# Patient Record
Sex: Male | Born: 1990 | Hispanic: No | Marital: Single | Smoking: Current every day smoker
Health system: Southern US, Community
[De-identification: ages and names within clinical notes are randomized; demographics above are authoritative.]

## PROBLEM LIST (undated history)

## (undated) DIAGNOSIS — R1013 Epigastric pain: Secondary | ICD-10-CM

---

## 2014-10-15 ENCOUNTER — Emergency Department (INDEPENDENT_AMBULATORY_CARE_PROVIDER_SITE_OTHER)
Admission: EM | Admit: 2014-10-15 | Discharge: 2014-10-15 | Disposition: A | Discharge status: Nursing Home (Includes ICF) | Payer: Self-pay | Source: Home / Self Care | Attending: Family Medicine | Admitting: Family Medicine

## 2014-10-15 ENCOUNTER — Emergency Department (HOSPITAL_COMMUNITY)
Admission: EM | Admit: 2014-10-15 | Discharge: 2014-10-15 | Disposition: A | Discharge status: Home / Self Care | Payer: Self-pay | Attending: Emergency Medicine | Admitting: Emergency Medicine

## 2014-10-15 ENCOUNTER — Encounter (HOSPITAL_COMMUNITY): Payer: Self-pay | Admitting: Emergency Medicine

## 2014-10-15 ENCOUNTER — Emergency Department (HOSPITAL_COMMUNITY): Payer: Self-pay

## 2014-10-15 ENCOUNTER — Encounter (HOSPITAL_COMMUNITY): Payer: Self-pay | Admitting: *Deleted

## 2014-10-15 DIAGNOSIS — R52 Pain, unspecified: Secondary | ICD-10-CM

## 2014-10-15 DIAGNOSIS — R509 Fever, unspecified: Secondary | ICD-10-CM

## 2014-10-15 DIAGNOSIS — R1084 Generalized abdominal pain: Secondary | ICD-10-CM | POA: Insufficient documentation

## 2014-10-15 DIAGNOSIS — R000 Tachycardia, unspecified: Secondary | ICD-10-CM | POA: Insufficient documentation

## 2014-10-15 DIAGNOSIS — J029 Acute pharyngitis, unspecified: Secondary | ICD-10-CM | POA: Insufficient documentation

## 2014-10-15 DIAGNOSIS — R252 Cramp and spasm: Secondary | ICD-10-CM

## 2014-10-15 DIAGNOSIS — Z720 Tobacco use: Secondary | ICD-10-CM | POA: Insufficient documentation

## 2014-10-15 LAB — CBC WITH DIFFERENTIAL/PLATELET
BASOS ABS: 0 10*3/uL (ref 0.0–0.1)
Basophils Relative: 0 % (ref 0–1)
Eosinophils Absolute: 0 10*3/uL (ref 0.0–0.7)
Eosinophils Relative: 0 % (ref 0–5)
HCT: 42.3 % (ref 39.0–52.0)
HEMOGLOBIN: 14.7 g/dL (ref 13.0–17.0)
LYMPHS PCT: 9 % — AB (ref 12–46)
Lymphs Abs: 0.8 10*3/uL (ref 0.7–4.0)
MCH: 28.8 pg (ref 26.0–34.0)
MCHC: 34.8 g/dL (ref 30.0–36.0)
MCV: 82.8 fL (ref 78.0–100.0)
MONO ABS: 0.5 10*3/uL (ref 0.1–1.0)
Monocytes Relative: 6 % (ref 3–12)
Neutro Abs: 7.1 10*3/uL (ref 1.7–7.7)
Neutrophils Relative %: 85 % — ABNORMAL HIGH (ref 43–77)
Platelets: 193 10*3/uL (ref 150–400)
RBC: 5.11 MIL/uL (ref 4.22–5.81)
RDW: 13 % (ref 11.5–15.5)
WBC: 8.4 10*3/uL (ref 4.0–10.5)

## 2014-10-15 LAB — POCT I-STAT, CHEM 8
BUN: 7 mg/dL (ref 6–20)
CHLORIDE: 99 mmol/L — AB (ref 101–111)
CREATININE: 0.8 mg/dL (ref 0.61–1.24)
Calcium, Ion: 1.1 mmol/L — ABNORMAL LOW (ref 1.12–1.23)
GLUCOSE: 105 mg/dL — AB (ref 70–99)
HEMATOCRIT: 50 % (ref 39.0–52.0)
Hemoglobin: 17 g/dL (ref 13.0–17.0)
POTASSIUM: 3.2 mmol/L — AB (ref 3.5–5.1)
SODIUM: 136 mmol/L (ref 135–145)
TCO2: 15 mmol/L (ref 0–100)

## 2014-10-15 LAB — COMPREHENSIVE METABOLIC PANEL
ALT: 22 U/L (ref 17–63)
AST: 23 U/L (ref 15–41)
Albumin: 4.2 g/dL (ref 3.5–5.0)
Alkaline Phosphatase: 62 U/L (ref 38–126)
Anion gap: 11 (ref 5–15)
BUN: 6 mg/dL (ref 6–20)
CALCIUM: 9.1 mg/dL (ref 8.9–10.3)
CO2: 21 mmol/L — AB (ref 22–32)
CREATININE: 0.88 mg/dL (ref 0.61–1.24)
Chloride: 101 mmol/L (ref 101–111)
GLUCOSE: 94 mg/dL (ref 70–99)
Potassium: 3 mmol/L — ABNORMAL LOW (ref 3.5–5.1)
Sodium: 133 mmol/L — ABNORMAL LOW (ref 135–145)
TOTAL PROTEIN: 7.5 g/dL (ref 6.5–8.1)
Total Bilirubin: 1 mg/dL (ref 0.3–1.2)

## 2014-10-15 LAB — RAPID STREP SCREEN (MED CTR MEBANE ONLY): STREPTOCOCCUS, GROUP A SCREEN (DIRECT): NEGATIVE

## 2014-10-15 LAB — CK: CK TOTAL: 129 U/L (ref 49–397)

## 2014-10-15 LAB — LIPASE, BLOOD: Lipase: 20 U/L — ABNORMAL LOW (ref 22–51)

## 2014-10-15 MED ORDER — SODIUM CHLORIDE 0.9 % IV SOLN
Freq: Once | INTRAVENOUS | Status: AC
  Administered 2014-10-15: 15:00:00 via INTRAVENOUS

## 2014-10-15 MED ORDER — HYDROMORPHONE HCL 1 MG/ML IJ SOLN
INTRAMUSCULAR | Status: AC
  Filled 2014-10-15: qty 1

## 2014-10-15 MED ORDER — SODIUM CHLORIDE 0.9 % IV BOLUS (SEPSIS)
1000.0000 mL | Freq: Once | INTRAVENOUS | Status: AC
  Administered 2014-10-15: 1000 mL via INTRAVENOUS

## 2014-10-15 MED ORDER — POTASSIUM CHLORIDE CRYS ER 20 MEQ PO TBCR
40.0000 meq | EXTENDED_RELEASE_TABLET | Freq: Once | ORAL | Status: AC
  Administered 2014-10-15: 40 meq via ORAL
  Filled 2014-10-15: qty 2

## 2014-10-15 MED ORDER — ACETAMINOPHEN 325 MG PO TABS: 975.0000 mg | ORAL_TABLET | Freq: Once | ORAL | Status: DC

## 2014-10-15 MED ORDER — HYDROMORPHONE HCL 1 MG/ML IJ SOLN: 1.0000 mg | Freq: Once | INTRAMUSCULAR | Status: DC

## 2014-10-15 MED ORDER — HYDROMORPHONE HCL 1 MG/ML IJ SOLN
1.0000 mg | Freq: Once | INTRAMUSCULAR | Status: AC
  Administered 2014-10-15: 1 mg via INTRAVENOUS
  Filled 2014-10-15: qty 1

## 2014-10-15 MED ORDER — ACETAMINOPHEN 325 MG PO TABS
650.0000 mg | ORAL_TABLET | Freq: Once | ORAL | Status: AC
  Administered 2014-10-15: 650 mg via ORAL
  Filled 2014-10-15: qty 2

## 2014-10-15 MED ORDER — HYDROMORPHONE HCL 1 MG/ML IJ SOLN
0.5000 mg | Freq: Once | INTRAMUSCULAR | Status: AC
  Administered 2014-10-15: 0.5 mg via INTRAVENOUS

## 2014-10-15 MED ORDER — KETOROLAC TROMETHAMINE 15 MG/ML IJ SOLN
15.0000 mg | Freq: Once | INTRAMUSCULAR | Status: AC
  Administered 2014-10-15: 15 mg via INTRAVENOUS
  Filled 2014-10-15: qty 1

## 2014-10-15 NOTE — ED Notes (Signed)
Pt writhing, thrashing, screaming out, hyperventilating.  Difficult to assess.  Carpal spasms noted.

## 2014-10-15 NOTE — ED Notes (Signed)
Report to [facility]  

## 2014-10-15 NOTE — ED Provider Notes (Signed)
Caleb Phillips is a 24 y.o. male who presents to Urgent Care today for spasms.  Patient arrived to the waiting room of the urgent care screaming and writhing in pain complaining of spasms in his hands and abdomen.  He additionally notes a severe headache. He states that he has had severe pain for over 24 hours now.  He is unable to answer questions regarding other associated symptoms. He is nearly incoherent with pain.  He states that he took some of his wife's medicine but is unable to tell us what that is.   He denies any spider bites.  He states that his symptoms were similar to when he had malaria 5 years ago but has not left the [place] since then.   History reviewed. No pertinent past medical history. History reviewed. No pertinent past surgical history. History  Substance Use Topics  . Smoking status: Current Every Day Smoker    Types: Cigarettes  . Smokeless tobacco: Not on file  . Alcohol Use: Not on file   ROS as above Medications: Current Facility-Administered Medications  Medication Dose Route Frequency Provider Last Rate Last Dose  . 0.9 %  sodium chloride infusion   Intravenous Once Rodolph BongEvan S Carvell Hoeffner, MD      . acetaminophen (TYLENOL) tablet 975 mg  975 mg Oral Once Rodolph BongEvan S Vito Beg, MD       No current outpatient prescriptions on file.   No Known Allergies   Exam:  BP 136/77 mmHg  Pulse 107  Temp(Src) 103.2 F (39.6 C) (Oral)  Resp 24  SpO2 100% Gen: Writhing and screaming HEENT: EOMI,  MMM Lungs: Normal work of breathing. Crackles left lung Heart: Tachy no MRG Abd: NABS, Soft. Nondistended, Nontender Exts: Brisk capillary refill, warm and well perfused.  Cramping hands BL.   Patient was given 0.5 mg of IV Dilaudid 975 mg of oral Tylenol.  Patient felt much better following Dilaudid.  Results for orders placed or performed during the hospital encounter of 10/15/14 (from the past 24 hour(s))  I-STAT, chem 8     Status: Abnormal   Collection Time: 10/15/14  2:49 PM   Result Value Ref Range   Sodium 136 135 - 145 mmol/L   Potassium 3.2 (L) 3.5 - 5.1 mmol/L   Chloride 99 (L) 101 - 111 mmol/L   BUN 7 6 - 20 mg/dL   Creatinine, Ser 4.090.80 0.61 - 1.24 mg/dL   Glucose, Bld 811105 (H) 70 - 99 mg/dL   Calcium, Ion 9.141.10 (L) 1.12 - 1.23 mmol/L   TCO2 15 0 - 100 mmol/L   Hemoglobin 17.0 13.0 - 17.0 g/dL   HCT 78.250.0 95.639.0 - 21.352.0 %   No results found.  Assessment and Plan: 24 y.o. male with pain and muscle spasms associated with headache and fever. Unclear etiology. Transfer to ED via EMS for further evaluation and management.   Discussed warning signs or symptoms. Please see discharge instructions. Patient expresses understanding.     Rodolph BongEvan S Grettell Ransdell, MD 10/15/14 [phone]

## 2014-10-15 NOTE — Discharge Instructions (Signed)
Take tylenol every 4 hrs for fever and aches.  If you were given medicines take as directed.  If you are on coumadin or contraceptives realize their levels and effectiveness is altered by many different medicines.  If you have any reaction (rash, tongues swelling, other) to the medicines stop taking and see a physician.   Please follow up as directed and return to the ER or see a physician for new or worsening symptoms.  Thank you. Filed Vitals:   10/15/14 1851 10/15/14 1852 10/15/14 1857 10/15/14 1900  BP: 119/59   114/56  Pulse: 97 107  98  Temp:   100.1 F (37.8 C)   TempSrc:   Oral   Resp:  17  14  Height:      Weight:      SpO2: 98% 100%  98%

## 2014-10-15 NOTE — ED Notes (Signed)
Pt much more calm now.  States stomach pain is improving.  States wife was sick last week "with same thing".  Took some IBU this AM.  Has had poor appetite.  Denies diarrhea.

## 2014-10-15 NOTE — ED Notes (Signed)
Pt presents to ED via [facility] from urgent care with c/o abdominal pain associated with nausea and vomiting, fever-temp-103.5 at urgent care, and sore throat. Per [facility], pt given 0.5mg  dilaudid. BP-135/73 and HR-110. Pt speaks [place] and some [place].

## 2014-10-15 NOTE — ED Provider Notes (Signed)
CSN: [id number]     Arrival date & time 10/15/14  1510 History   First MD Initiated Contact with Patient 10/15/14 1527     Chief Complaint  Patient presents with  . Abdominal Pain  . Fever     (Consider location/radiation/quality/duration/timing/severity/associated sxs/prior Treatment) Patient is a 24 y.o. male presenting with abdominal pain. The history is provided by the patient.  Abdominal Pain Pain location:  Generalized Pain quality: cramping   Pain radiates to:  Does not radiate Pain severity:  Moderate Onset quality:  Gradual Duration:  1 day Timing:  Intermittent Progression:  Waxing and waning Chronicity:  New Context: sick contacts   Context: not diet changes, not recent travel, not suspicious food intake and not trauma   Relieved by:  Nothing Worsened by:  Nothing tried Ineffective treatments:  None tried Associated symptoms: cough, fever and sore throat   Associated symptoms: no anorexia, no chest pain, no chills, no constipation, no diarrhea, no dysuria, no fatigue, no hematemesis, no hematochezia, no hematuria, no melena, no nausea, no shortness of breath and no vomiting   Cough:    Cough characteristics:  Non-productive   Sputum characteristics:  Nondescript   Severity:  Moderate   Onset quality:  Gradual   Duration:  2 days   Timing:  Constant   Progression:  Unchanged   Chronicity:  New Fever:    Duration:  2 days   Timing:  Intermittent   Temp source:  Subjective   Progression:  Waxing and waning Sore throat:    Severity:  Moderate   Onset quality:  Gradual   Duration:  2 days   Timing:  Constant   Progression:  Unchanged   History reviewed. No pertinent past medical history. History reviewed. No pertinent past surgical history. History reviewed. No pertinent family history. History  Substance Use Topics  . Smoking status: Current Every Day Smoker -- 0.50 packs/day    Types: Cigarettes  . Smokeless tobacco: Former Neurosurgeon  . Alcohol Use: Yes     Comment: occ    Review of Systems  Constitutional: Positive for fever. Negative for chills, diaphoresis, activity change, appetite change and fatigue.  HENT: Positive for sore throat. Negative for congestion, drooling, ear pain, rhinorrhea, trouble swallowing and voice change.   Respiratory: Positive for cough. Negative for chest tightness, shortness of breath, wheezing and stridor.   Cardiovascular: Negative for chest pain, palpitations and leg swelling.  Gastrointestinal: Positive for abdominal pain. Negative for nausea, vomiting, diarrhea, constipation, melena, hematochezia, abdominal distention, anorexia and hematemesis.  Genitourinary: Negative for dysuria, hematuria and difficulty urinating.  Musculoskeletal: Negative for back pain, neck pain and neck stiffness.  Skin: Negative for color change, pallor and rash.  Neurological: Negative for dizziness, light-headedness and headaches.  All other systems reviewed and are negative.     Allergies  Review of patient's allergies indicates no known allergies.  Home Medications   Prior to Admission medications   Not on File   BP 111/59 mmHg  Pulse 88  Temp(Src) 98.9 F (37.2 C) (Oral)  Resp 16  Ht  (1.727 m)  Wt 169 lb (76.658 kg)  BMI 25.70 kg/m2  SpO2 97% Physical Exam  Constitutional: He is oriented to person, place, and time. He appears well-developed and well-nourished. No distress.  HENT:  Head: Normocephalic and atraumatic.  Right Ear: Tympanic membrane normal.  Left Ear: Tympanic membrane normal.  Mouth/Throat: Posterior oropharyngeal erythema present. No oropharyngeal exudate, posterior oropharyngeal edema or tonsillar abscesses.  Neck:  Normal range of motion. Neck supple.  Cardiovascular: Regular rhythm, normal heart sounds and intact distal pulses.  Tachycardia present.  Exam reveals no gallop and no friction rub.   No murmur heard. Pulmonary/Chest: Effort normal and breath sounds normal. No respiratory  distress. He has no wheezes. He has no rales.  Abdominal: Soft. Normal appearance and bowel sounds are normal. He exhibits no distension. There is no tenderness. There is no rigidity, no rebound, no guarding, no tenderness at McBurney's point and negative Murphy's sign.  Musculoskeletal: Normal range of motion. He exhibits no edema.  Neurological: He is alert and oriented to person, place, and time.  Skin: Skin is warm and dry. No rash noted. He is not diaphoretic. No erythema. No pallor.  Nursing note and vitals reviewed.   ED Course  Procedures (including critical care time) Labs Review Labs Reviewed  COMPREHENSIVE METABOLIC PANEL - Abnormal; Notable for the following:    Sodium 133 (*)    Potassium 3.0 (*)    CO2 21 (*)    All other components within normal limits  CBC WITH DIFFERENTIAL/PLATELET - Abnormal; Notable for the following:    Neutrophils Relative % 85 (*)    Lymphocytes Relative 9 (*)    All other components within normal limits  LIPASE, BLOOD - Abnormal; Notable for the following:    Lipase 20 (*)    All other components within normal limits  RAPID STREP SCREEN  CULTURE, GROUP A STREP  CK    Imaging Review Dg Chest 2 View  10/15/2014   CLINICAL DATA:  Fevers and cramping  EXAM: CHEST  2 VIEW  COMPARISON:  None.  FINDINGS: Cardiac shadow is within normal limits. The lungs are well aerated bilaterally. Mild atelectatic changes are noted in the left retrocardiac region. No sizable effusion is seen. No bony abnormality is seen.  IMPRESSION: Mild left basilar atelectasis.  No other focal abnormality is noted.   Electronically Signed   By: Alcide CleverMark  Lukens M.D.   On: 10/15/2014 19:10     EKG Interpretation None      MDM   Final diagnoses:  Fever, unspecified fever cause  Sore throat    24 yo M with no significant PMH presenting with fever, sore throat, abdominal pain.  Onset yesterday.  Pt reports sore throat began yesterday with non-productive cough and subjective  fever.  Onset today of abdominal pain.  Pt reports spasms of abdominal pain, generalized, non-radiating.  No vomiting, diarrhea, dysuria.  Went to urgent care, had abdominal spasm which was relieved with Dilaudid.  Transferred from urgent care.  Wife reportedly ill with similar symptoms at home.  Pt reportedly had malaria while in [place] 5 years ago, however reports no foreign travel since that time.  On presentation, pt febrile to 103, tachycardic, vitals otherwise stable.  Pt alert, well appearing, in NAD.  Currently endorses sore throat but no abdominal pain.  Mild pharyngeal erythema without exudate or tonsillar swelling on exam.  No meningismus or cervical LAD.  CV and lung exam WNL.  Abdomen soft, non-tender, non-distended.  No rashes or other acute findings.  Likely viral illness as wife has similar symptoms at home.  Plan for strep screen, CXR, labs.  Fluids and tylenol given.  Abdominal exam not consistent with acute appendicitis, cholecystitis, SBO, mesenteric ischemia.  No recent foreign travel making malaria unlikely.  CXR, strep screen, labs WNL.  Pt stable for discharge. Given f/u info for community wellness clinic.  ED return precautions given.  No other concerns.  Discussed with attending Dr. Jodi MourningZavitz.    Jodean LimaEmily Cephus Tupy, MD 10/17/14 1515  Blane OharaJoshua Zavitz, MD 10/19/14 [phone]

## 2014-10-15 NOTE — ED Notes (Signed)
Report called to Efraim KaufmannMelissa, ED RN per Karenann CaiN. Stack, RN.  Report given to [facility].

## 2014-10-17 LAB — CULTURE, GROUP A STREP: Strep A Culture: NEGATIVE

## 2016-01-25 ENCOUNTER — Encounter (HOSPITAL_COMMUNITY): Payer: Self-pay

## 2016-01-25 ENCOUNTER — Emergency Department (HOSPITAL_COMMUNITY): Payer: Self-pay

## 2016-01-25 ENCOUNTER — Emergency Department (HOSPITAL_COMMUNITY)
Admission: EM | Admit: 2016-01-25 | Discharge: 2016-01-26 | Disposition: A | Discharge status: Home / Self Care | Payer: Self-pay | Attending: Emergency Medicine | Admitting: Emergency Medicine

## 2016-01-25 DIAGNOSIS — F17210 Nicotine dependence, cigarettes, uncomplicated: Secondary | ICD-10-CM | POA: Insufficient documentation

## 2016-01-25 DIAGNOSIS — Y999 Unspecified external cause status: Secondary | ICD-10-CM | POA: Insufficient documentation

## 2016-01-25 DIAGNOSIS — Y92410 Unspecified street and highway as the place of occurrence of the external cause: Secondary | ICD-10-CM | POA: Insufficient documentation

## 2016-01-25 DIAGNOSIS — Y939 Activity, unspecified: Secondary | ICD-10-CM | POA: Insufficient documentation

## 2016-01-25 DIAGNOSIS — R51 Headache: Secondary | ICD-10-CM | POA: Insufficient documentation

## 2016-01-25 DIAGNOSIS — R079 Chest pain, unspecified: Secondary | ICD-10-CM | POA: Insufficient documentation

## 2016-01-25 MED ORDER — ORPHENADRINE CITRATE ER 100 MG PO TB12: 100.0000 mg | ORAL_TABLET | Freq: Two times a day (BID) | ORAL | 0 refills | Status: AC | PRN

## 2016-01-25 MED ORDER — IBUPROFEN 400 MG PO TABS
800.0000 mg | ORAL_TABLET | Freq: Once | ORAL | Status: AC
  Administered 2016-01-25: 800 mg via ORAL
  Filled 2016-01-25: qty 2

## 2016-01-25 MED ORDER — IBUPROFEN 800 MG PO TABS: 800.0000 mg | ORAL_TABLET | Freq: Three times a day (TID) | ORAL | 0 refills | Status: AC
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
## 2016-01-26 NOTE — ED Notes (Signed)
Pt stable, ambulatory, states understanding of discharge instructions 

## 2016-12-24 ENCOUNTER — Ambulatory Visit (HOSPITAL_COMMUNITY)
Admission: EM | Admit: 2016-12-24 | Discharge: 2016-12-24 | Disposition: A | Discharge status: Other Acute Inpatient Hospital | Payer: Self-pay

## 2016-12-24 ENCOUNTER — Emergency Department (HOSPITAL_COMMUNITY)
Admission: EM | Admit: 2016-12-24 | Discharge: 2016-12-24 | Disposition: A | Discharge status: Home / Self Care | Payer: Self-pay | Attending: Emergency Medicine | Admitting: Emergency Medicine

## 2016-12-24 ENCOUNTER — Emergency Department (HOSPITAL_COMMUNITY): Payer: Self-pay

## 2016-12-24 ENCOUNTER — Encounter (HOSPITAL_COMMUNITY): Payer: Self-pay

## 2016-12-24 DIAGNOSIS — F17210 Nicotine dependence, cigarettes, uncomplicated: Secondary | ICD-10-CM | POA: Insufficient documentation

## 2016-12-24 DIAGNOSIS — R079 Chest pain, unspecified: Secondary | ICD-10-CM | POA: Insufficient documentation

## 2016-12-24 DIAGNOSIS — R1310 Dysphagia, unspecified: Secondary | ICD-10-CM | POA: Insufficient documentation

## 2016-12-24 MED ORDER — FAMOTIDINE 20 MG PO TABS: 20.0000 mg | ORAL_TABLET | Freq: Two times a day (BID) | ORAL | 0 refills | Status: AC

## 2016-12-24 NOTE — ED Notes (Signed)
Pt transported to radiology.

## 2016-12-24 NOTE — Discharge Instructions (Signed)
Your barium swallow test today was normal. Take pepcid daily. Follow up with family doctor.

## 2016-12-24 NOTE — ED Triage Notes (Signed)
Pt endorses eating chicken wings on Thursday and pt states "i feel like something is stuck in my throat since" Pt still able to eat and drink but endorses some discomfort in his throat. VSS. Airway intact.

## 2016-12-24 NOTE — ED Provider Notes (Signed)
[facility]-EMER[facility]Y DEPT Provider Note   CSN: [id number] Arrival date & time: 12/24/16  1116  By signing my name below, I, Cynda AcresHailei Fulton, attest that this documentation has been prepared under the direction and in the presence of Devri Kreher, PA-C. Electronically Signed: Cynda AcresHailei Fulton, Scribe. 12/24/16. 12:31 PM.  History   Chief Complaint Chief Complaint  Patient presents with  . Sore Throat   HPI Comments: Caleb Phillips is a 26 y.o. male with no apparent past medical history, who presents to the Emergency Department complaining of a persistent sore throat that occurred one week ago. Patient states he was eating boneless chicken wings, when he felt something get stuck in his throat. Patient is still able to eat and drink, but endorses pain in his throat and chest when doing so. No medications taken prior to arrival. Patient states his symptoms are worse with swallowing, nothing improves his pain. Patient denies any shortness of breath, chest pain, fever, chills, nausea, vomiting, or any additional symptoms.   The history is provided by the patient. No language interpreter was used.    History reviewed. No pertinent past medical history.  There are no active problems to display for this patient.   History reviewed. No pertinent surgical history.     Home Medications    Prior to Admission medications   Medication Sig Start Date End Date Taking? Authorizing Provider  ibuprofen (ADVIL,MOTRIN) 800 MG tablet Take 1 tablet (800 mg total) by mouth 3 (three) times daily. 01/25/16   Law, Waylan BogaAlexandra M, PA-C  orphenadrine (NORFLEX) 100 MG tablet Take 1 tablet (100 mg total) by mouth 2 (two) times daily as needed for muscle spasms. 01/25/16   Emi HolesLaw, Alexandra M, PA-C    Family History History reviewed. No pertinent family history.  Social History Social History  Substance Use Topics  . Smoking status: Current Every Day Smoker    Packs/day: 0.50    Types: Cigarettes  . Smokeless tobacco:  Former NeurosurgeonUser  . Alcohol use Yes     Comment: occ     Allergies   Patient has no known allergies.   Review of Systems Review of Systems  Constitutional: Negative for chills and fever.  HENT: Positive for sore throat.   Respiratory: Negative for shortness of breath.   Cardiovascular: Positive for chest pain.  Gastrointestinal: Negative for nausea and vomiting.  All other systems reviewed and are negative.    Physical Exam Updated Vital Signs BP 117/74 (BP Location: Left Arm)   Pulse 79   Temp 98 F (36.7 C) (Oral)   Resp 19   Ht 5\' 8"  (1.727 m)   Wt 160 lb (72.6 kg)   SpO2 98%   BMI 24.33 kg/m   Physical Exam  Constitutional: He is oriented to person, place, and time. He appears well-developed.  HENT:  Head: Normocephalic and atraumatic.  Mouth/Throat: Oropharynx is clear and moist.  Eyes: Pupils are equal, round, and reactive to light. Conjunctivae and EOM are normal.  Neck: Normal range of motion. Neck supple.  Cardiovascular: Normal rate, regular rhythm and normal heart sounds.   Pulmonary/Chest: Effort normal and breath sounds normal. No respiratory distress.  Neurological: He is alert and oriented to person, place, and time.  Skin: Skin is warm and dry.  Nursing note and vitals reviewed.    ED Treatments / Results  DIAGNOSTIC STUDIES: Oxygen Saturation is 98% on RA, normal by my interpretation.    COORDINATION OF CARE: 12:29 PM Discussed treatment plan with pt at bedside  and pt agreed to plan, which includes imaging.   Labs (all labs ordered are listed, but only abnormal results are displayed) Labs Reviewed - No data to display  EKG  EKG Interpretation None       Radiology Dg Esophagus  Result Date: 12/24/2016 CLINICAL DATA:  26 year old male with sensation of something stuck in his esophagus and some pain at the level of the sternal notch. Symptoms began after eating chicken wings last week. The patient is able to eat and drink despite the  symptoms. EXAM: ESOPHOGRAM / BARIUM SWALLOW / BARIUM TABLET STUDY TECHNIQUE: Single contrast examination performed using thin barium liquid. The patient was observed with fluoroscopy swallowing a 13 mm barium sulphate tablet. FLUOROSCOPY TIME:  Fluoroscopy Time:  0 minutes 48 seconds Radiation Exposure Index (if provided by the fluoroscopic device): Number of Acquired Spot Images: 0 COMPARISON:  Chest radiographs 01/25/2016 FINDINGS: A single contrast study was undertaken and the patient tolerated this well and without difficulty. No obstruction to the forward flow of contrast throughout the esophagus and into the stomach. Normal esophageal course and contour. Normal esophageal mucosal pattern. No contrast leak or abnormal accumulation occurred. Normal cervical esophagus. A 12.5 mm barium tablet was administered and passed freely to the stomach without delay. IMPRESSION: Normal barium esophagram. Electronically Signed   By: Odessa Fleming M.D.   On: 12/24/2016 14:35    Procedures Procedures (including critical care time)  Medications Ordered in ED Medications - No data to display   Initial Impression / Assessment and Plan / ED Course  I have reviewed the triage vital signs and the nursing notes.  Pertinent labs & imaging results that were available during my care of the patient were reviewed by me and considered in my medical decision making (see chart for details).     Pt in ED with pain in his throat and chest after eating chicken 1 week ago. He is able to swallow liquids, but pain with swallowing solids. Exam unremarkable. Will get barium swallow for possible evaluation of strictures vs foreign body.    2:57 PM barrium swallow normal. Will start on pepcid for possible reflux. Will have pt follow up with PCP.   Vitals:   12/24/16 1126 12/24/16 1128 12/24/16 1453  BP:  117/74 109/70  Pulse:  79 64  Resp:  19 18  Temp:  98 F (36.7 C) 97.9 F (36.6 C)  TempSrc:  Oral Oral  SpO2:  98% 99%    Weight: 72.6 kg (160 lb)    Height: 5\' 8"  (1.727 m)       Final Clinical Impressions(s) / ED Diagnoses   Final diagnoses:  Difficulty swallowing  Dysphagia, unspecified type    New Prescriptions New Prescriptions   FAMOTIDINE (PEPCID) 20 MG TABLET    Take 1 tablet (20 mg total) by mouth 2 (two) times daily.   I personally performed the services described in this documentation, which was scribed in my presence. The recorded information has been reviewed and is accurate.    Jaynie Crumble, PA-C 12/24/16 1459    Alvira Monday, MD 12/25/16 1404

## 2017-02-26 ENCOUNTER — Encounter (HOSPITAL_COMMUNITY): Payer: Self-pay

## 2017-02-26 ENCOUNTER — Emergency Department (HOSPITAL_COMMUNITY): Payer: Self-pay

## 2017-02-26 ENCOUNTER — Emergency Department (HOSPITAL_COMMUNITY)
Admission: EM | Admit: 2017-02-26 | Discharge: 2017-02-26 | Disposition: A | Discharge status: Home / Self Care | Payer: Self-pay | Attending: Emergency Medicine | Admitting: Emergency Medicine

## 2017-02-26 DIAGNOSIS — K30 Functional dyspepsia: Secondary | ICD-10-CM | POA: Insufficient documentation

## 2017-02-26 DIAGNOSIS — F17210 Nicotine dependence, cigarettes, uncomplicated: Secondary | ICD-10-CM | POA: Insufficient documentation

## 2017-02-26 DIAGNOSIS — R1084 Generalized abdominal pain: Secondary | ICD-10-CM | POA: Insufficient documentation

## 2017-02-26 DIAGNOSIS — R109 Unspecified abdominal pain: Secondary | ICD-10-CM

## 2017-02-26 LAB — COMPREHENSIVE METABOLIC PANEL
ALBUMIN: 4.4 g/dL (ref 3.5–5.0)
ALT: 23 U/L (ref 17–63)
AST: 29 U/L (ref 15–41)
Alkaline Phosphatase: 76 U/L (ref 38–126)
Anion gap: 4 — ABNORMAL LOW (ref 5–15)
BILIRUBIN TOTAL: 0.8 mg/dL (ref 0.3–1.2)
BUN: 8 mg/dL (ref 6–20)
CO2: 26 mmol/L (ref 22–32)
Calcium: 9.7 mg/dL (ref 8.9–10.3)
Chloride: 107 mmol/L (ref 101–111)
Creatinine, Ser: 0.66 mg/dL (ref 0.61–1.24)
GFR calc Af Amer: >60 mL/min (ref >60)
GFR calc non Af Amer: >60 mL/min (ref >60)
GLUCOSE: 108 mg/dL — AB (ref 65–99)
POTASSIUM: 4 mmol/L (ref 3.5–5.1)
Sodium: 137 mmol/L (ref 135–145)
Total Protein: 7.1 g/dL (ref 6.5–8.1)

## 2017-02-26 LAB — URINALYSIS, ROUTINE W REFLEX MICROSCOPIC
BILIRUBIN URINE: NEGATIVE
GLUCOSE, UA: NEGATIVE mg/dL
HGB URINE DIPSTICK: NEGATIVE
KETONES UR: NEGATIVE mg/dL
Leukocytes, UA: NEGATIVE
NITRITE: NEGATIVE
PH: 6 (ref 5.0–8.0)
Protein, ur: NEGATIVE mg/dL
Specific Gravity, Urine: 1.005 (ref 1.005–1.030)

## 2017-02-26 LAB — LIPASE, BLOOD: Lipase: 34 U/L (ref 11–51)

## 2017-02-26 LAB — CBC
HCT: 44.6 % (ref 39.0–52.0)
Hemoglobin: 15.4 g/dL (ref 13.0–17.0)
MCH: 29.9 pg (ref 26.0–34.0)
MCHC: 34.5 g/dL (ref 30.0–36.0)
MCV: 86.6 fL (ref 78.0–100.0)
Platelets: 255 10*3/uL (ref 150–400)
RBC: 5.15 MIL/uL (ref 4.22–5.81)
RDW: 12.9 % (ref 11.5–15.5)
WBC: 6 10*3/uL (ref 4.0–10.5)

## 2017-02-26 MED ORDER — OMEPRAZOLE 20 MG PO CPDR: 20.0000 mg | DELAYED_RELEASE_CAPSULE | Freq: Every day | ORAL | 0 refills | Status: AC

## 2017-02-26 NOTE — ED Notes (Signed)
PA at bedside.

## 2017-02-26 NOTE — ED Triage Notes (Signed)
Pt states that he has been having abd pain and distension along with constipation. Pt states pain is worse when he needs to  Have a bowel movement, last BM today.

## 2017-02-26 NOTE — ED Provider Notes (Signed)
[facility] Provider Note   CSN: [id number] Arrival date & time: 02/26/17  0020    History   Chief Complaint Chief Complaint  Patient presents with  . Abdominal Pain    HPI Caleb Phillips is a 26 y.o. male.  The history is provided by the patient. No language interpreter was used.  Abdominal Pain   This is a recurrent problem. Episode onset: 2-3 years ago. The problem occurs every several days. Progression since onset: waxing and waning. The pain is associated with eating. The pain is located in the generalized abdominal region. The pain is mild. Associated symptoms include flatus. Pertinent negatives include fever, nausea and vomiting. The symptoms are aggravated by eating (especially spicy foods). The symptoms are relieved by bowel activity. Past workup does not include GI consult, CT scan or surgery.    History reviewed. No pertinent past medical history.  There are no active problems to display for this patient.   History reviewed. No pertinent surgical history.    Home Medications    Prior to Admission medications   Medication Sig Start Date End Date Taking? Authorizing Provider  famotidine (PEPCID) 20 MG tablet Take 1 tablet (20 mg total) by mouth 2 (two) times daily. 12/24/16   Kirichenko, Tatyana, PA-C  ibuprofen (ADVIL,MOTRIN) 800 MG tablet Take 1 tablet (800 mg total) by mouth 3 (three) times daily. 01/25/16   Law, Waylan Boga, PA-C  omeprazole (PRILOSEC) 20 MG capsule Take 1 capsule (20 mg total) by mouth daily. 02/26/17   Antony Madura, PA-C  orphenadrine (NORFLEX) 100 MG tablet Take 1 tablet (100 mg total) by mouth 2 (two) times daily as needed for muscle spasms. 01/25/16   Emi Holes, PA-C    Family History No family history on file.  Social History Social History  Substance Use Topics  . Smoking status: Current Every Day Smoker    Packs/day: 0.50    Types: Cigarettes  . Smokeless tobacco: Former Neurosurgeon  . Alcohol use Yes     Comment: occ      Allergies   Patient has no known allergies.   Review of Systems Review of Systems  Constitutional: Negative for fever.  Gastrointestinal: Positive for abdominal pain and flatus. Negative for nausea and vomiting.   Ten systems reviewed and are negative for acute change, except as noted in the HPI.    Physical Exam Updated Vital Signs BP 109/77 (BP Location: Left Arm)   Pulse 75   Temp 97.8 F (36.6 C) (Oral)   Resp 18   Ht  (1.702 m)   Wt 69.9 kg (154 lb)   SpO2 100%   BMI 24.12 kg/m   Physical Exam  Constitutional: He is oriented to person, place, and time. He appears well-developed and well-nourished. No distress.  Nontoxic appearing and in NAD  HENT:  Head: Normocephalic and atraumatic.  Eyes: Conjunctivae and EOM are normal. No scleral icterus.  Neck: Normal range of motion.  Cardiovascular: Normal rate, regular rhythm and intact distal pulses.   Pulmonary/Chest: Effort normal. No respiratory distress. He has no wheezes.  Abdominal: Soft. He exhibits no distension and no mass. There is no tenderness. There is no guarding.  Soft, nontender, nondistended abdomen  Musculoskeletal: Normal range of motion.  Neurological: He is alert and oriented to person, place, and time. He exhibits normal muscle tone. Coordination normal.  Skin: Skin is warm and dry. No rash noted. He is not diaphoretic. No erythema. No pallor.  Psychiatric: He has a normal  mood and affect. His behavior is normal.  Nursing note and vitals reviewed.    ED Treatments / Results  Labs (all labs ordered are listed, but only abnormal results are displayed) Labs Reviewed  COMPREHENSIVE METABOLIC PANEL - Abnormal; Notable for the following:       Result Value   Glucose, Bld 108 (*)    Anion gap 4 (*)    All other components within normal limits  URINALYSIS, ROUTINE W REFLEX MICROSCOPIC - Abnormal; Notable for the following:    Color, Urine STRAW (*)    All other components within normal  limits  LIPASE, BLOOD  CBC    EKG  EKG Interpretation None       Radiology Dg Abd 2 Views  Result Date: 02/26/2017 CLINICAL DATA:  Abdominal pain and nausea for 2 days. EXAM: ABDOMEN - 2 VIEW COMPARISON:  None. FINDINGS: Scattered gas and stool throughout the colon. No small or large bowel distention. No free intra-abdominal air. No abnormal air-fluid levels. No radiopaque stones. Visualized bones appear intact. IMPRESSION: Normal nonobstructive bowel gas pattern. Electronically Signed   By: Burman Nieves M.D.   On: 02/26/2017 05:15    Procedures Procedures (including critical care time)  Medications Ordered in ED Medications - No data to display   Initial Impression / Assessment and Plan / ED Course  I have reviewed the triage vital signs and the nursing notes.  Pertinent labs & imaging results that were available during my care of the patient were reviewed by me and considered in my medical decision making (see chart for details).     Patient presenting for mild periumbilical and epigastric abdominal discomfort with associated distention. Patient has had symptoms intermittently over the past 2-3 years. No vomiting or fevers. He has a soft abdomen that is nontender today with no evidence of peritonitis. His history is consistent with likely indigestion and reflux. Laboratory workup and imaging today is reassuring.  I do not believe further emergent workup is indicated, especially in light of symptom chronicity. Will start the patient on omeprazole for management and referred to primary care. Return precautions discussed and provided. Patient discharged in stable condition with no unaddressed concerns.   Final Clinical Impressions(s) / ED Diagnoses   Final diagnoses:  Abdominal pain  Indigestion    New Prescriptions Discharge Medication List as of 02/26/2017  5:22 AM    START taking these medications   Details  omeprazole (PRILOSEC) 20 MG capsule Take 1 capsule (20  mg total) by mouth daily., Starting Wed 02/26/2017, Print         Antony Madura, PA-C 02/26/17 1610    Glynn Octave, MD 02/26/17 [phone]

## 2017-02-26 NOTE — ED Notes (Signed)
Pt transported to xray 

## 2017-12-21 IMAGING — RF DG ESOPHAGUS
8 of 10 series · 14 of 24 positions shown · non-contrast
Comparison: Chest radiographs 01/25/2016

CLINICAL DATA: 25-year-old male with sensation of something stuck
in his esophagus and some pain at the level of the sternal notch.
Symptoms began after eating chicken wings last week. The patient is
able to eat and drink despite the symptoms.

EXAM:
ESOPHOGRAM / BARIUM SWALLOW / BARIUM TABLET STUDY
TECHNIQUE: Single contrast examination performed using thin barium liquid. The
patient was observed with fluoroscopy swallowing a 13 mm barium
sulphate tablet.
FLUOROSCOPY TIME:  Fluoroscopy Time:  0 minutes 48 seconds
Radiation Exposure Index (if provided by the fluoroscopic device):
Number of Acquired Spot Images: 0

[Series 1: cp_standard · 0.53mm/px · 2 of 54 frames shown (1 of 8)]
[frame 9/54]
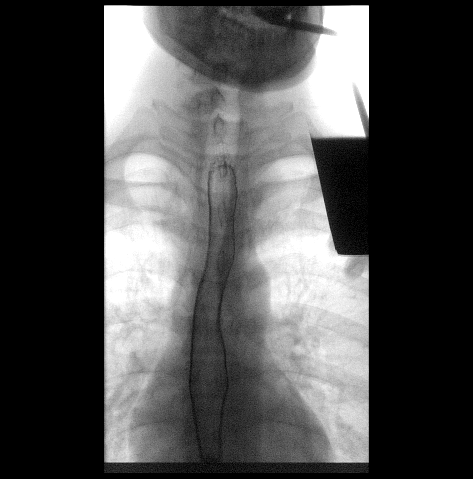
[frame 28/54]
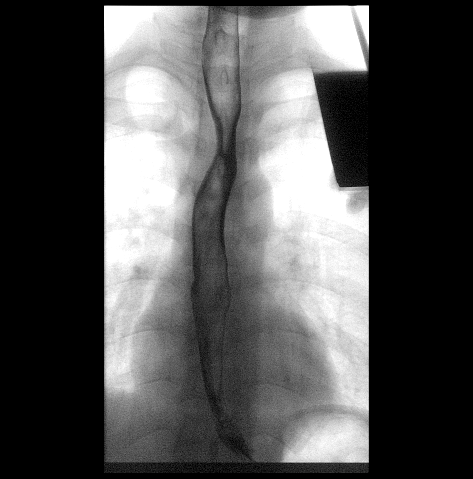

[Series 2: cp_standard · 0.18mm/px · 1 of 1 slices shown (2 of 8)]
[im 1/1]
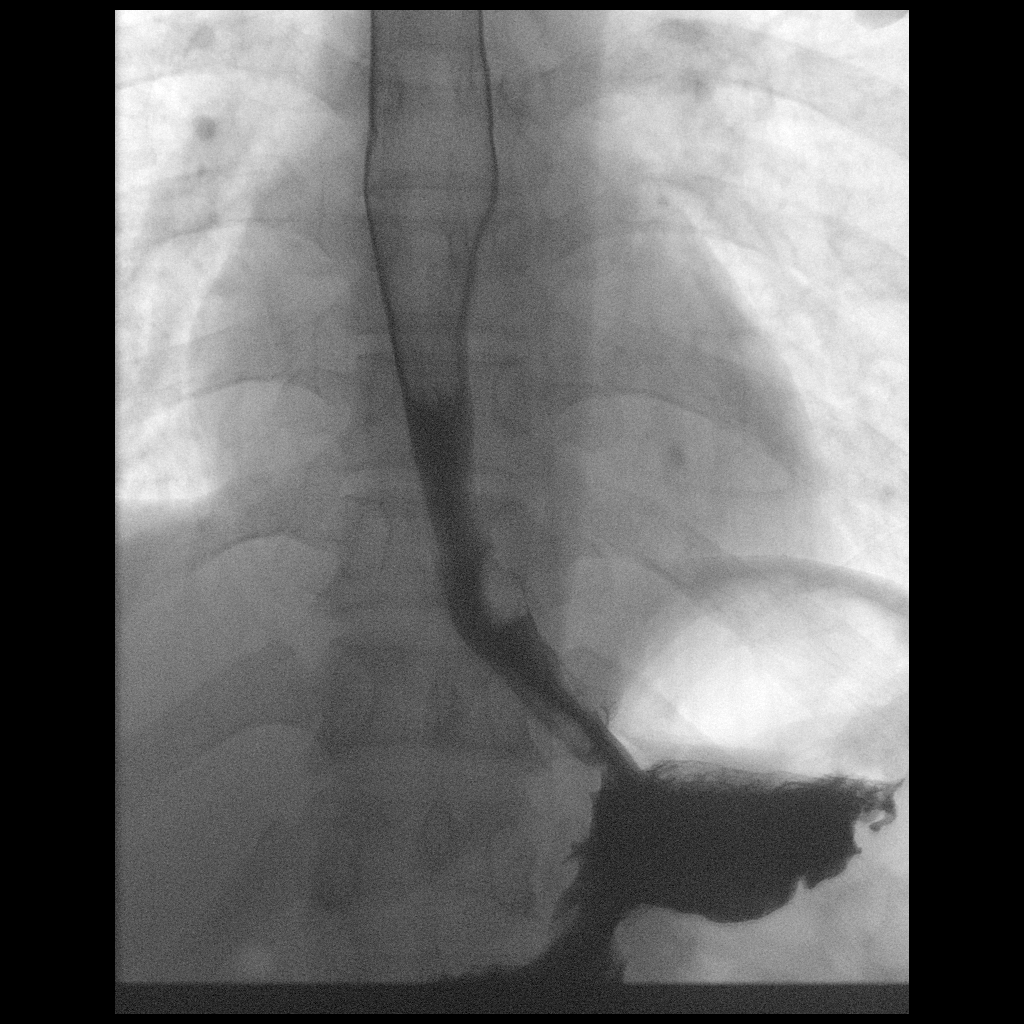

[Series 4: cp_standard · 0.18mm/px · 1 of 1 slices shown (3 of 8)]
[im 1/1]
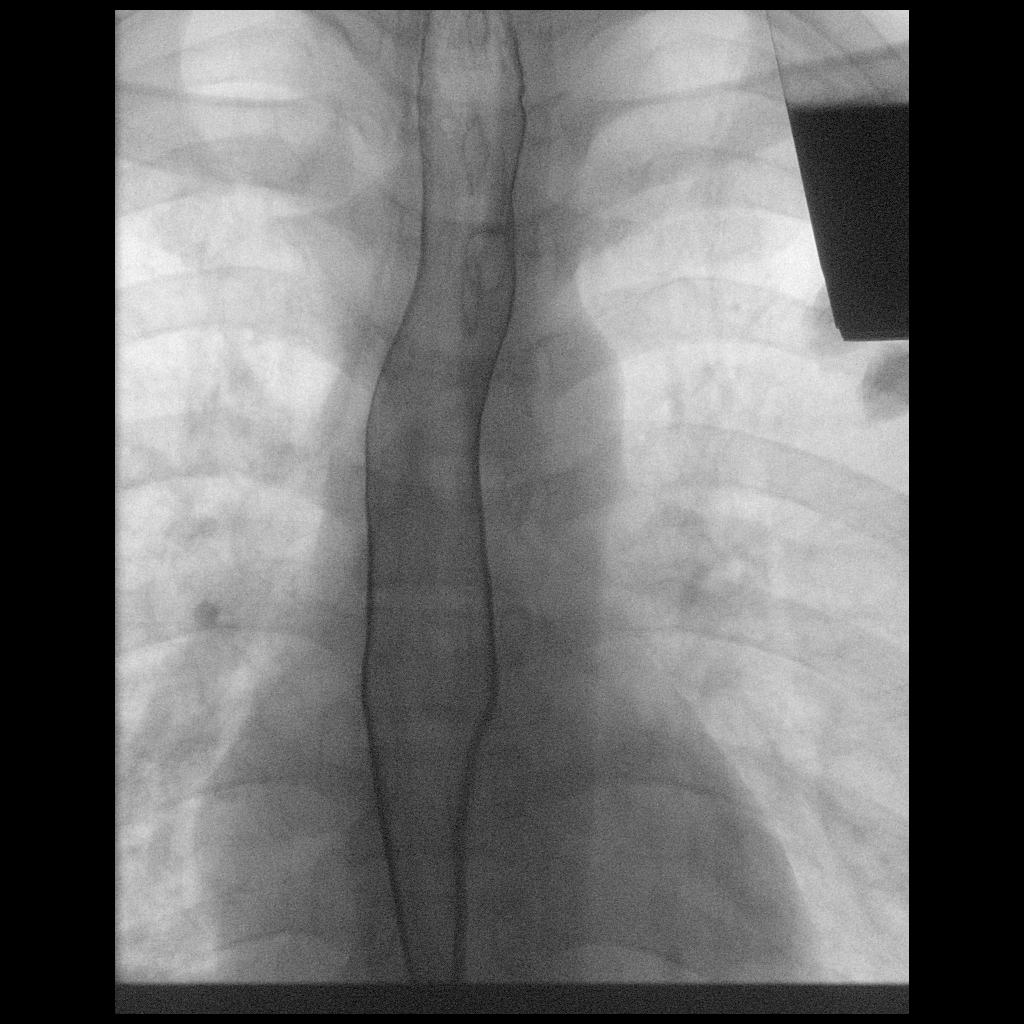

[Series 5: cp_standard · 0.18mm/px · 1 of 1 slices shown (4 of 8)]
[im 1/1]
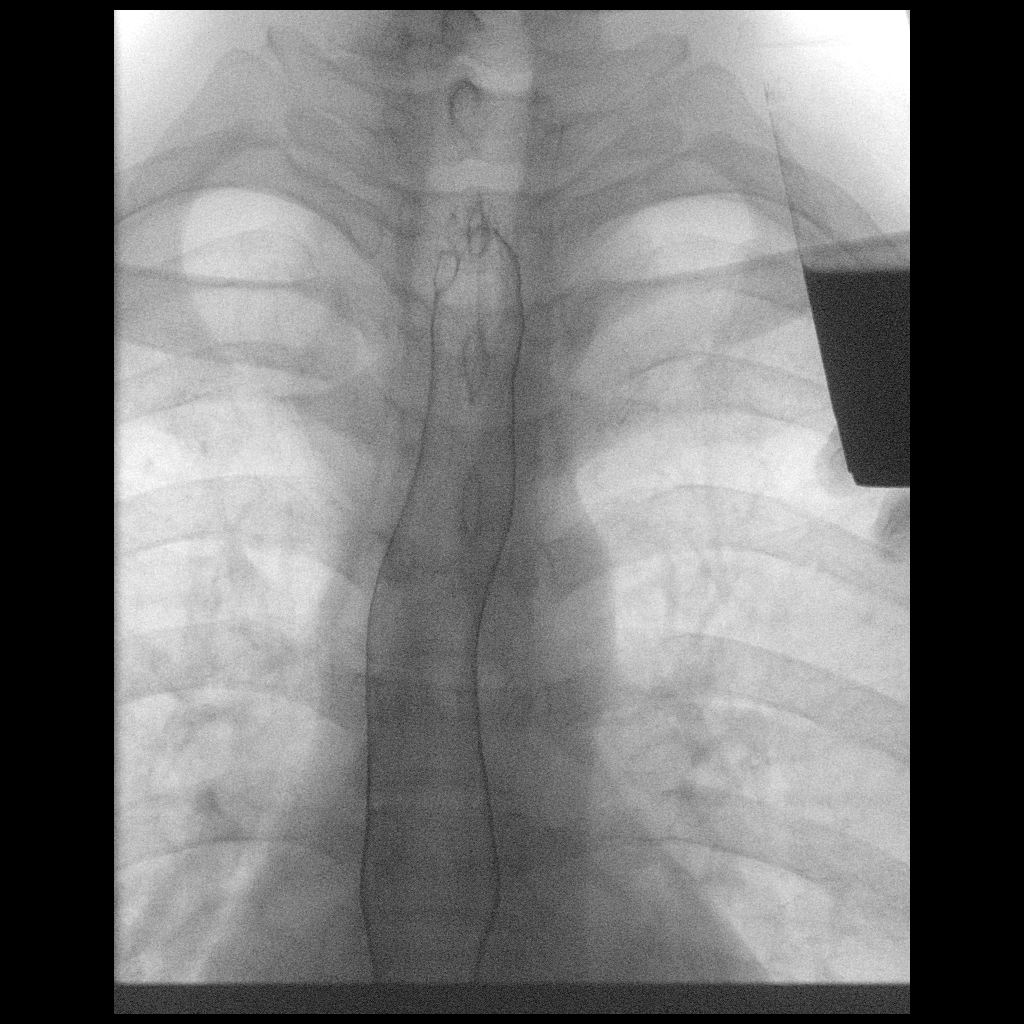

[Series 7: cp_standard · 0.35mm/px · 2 of 51 frames shown (5 of 8)]
[frame 8/51]
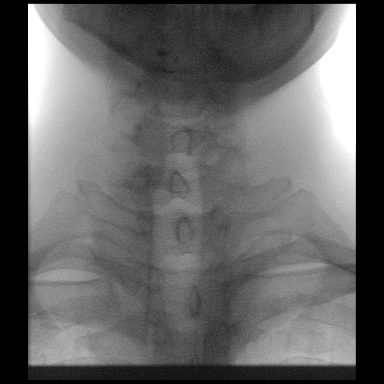
[frame 26/51]
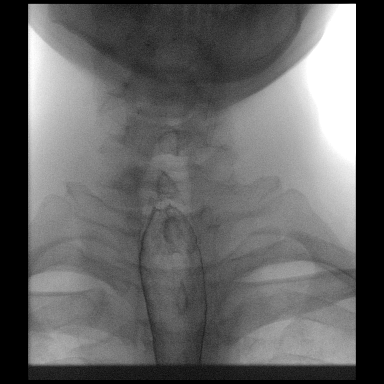

[Series 8: cp_standard · 0.35mm/px · 2 of 17 frames shown (6 of 8)]
[frame 3/17]
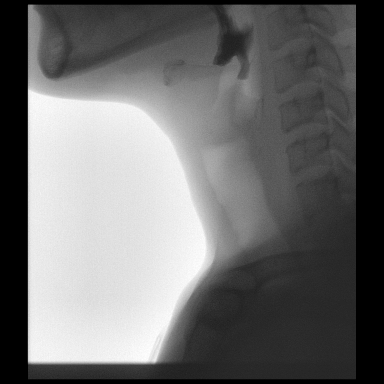
[frame 9/17]
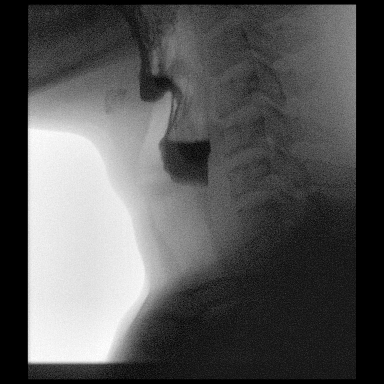

[Series 9: cp_standard · 0.35mm/px · 3 of 34 frames shown (7 of 8)]
[frame 6/34]
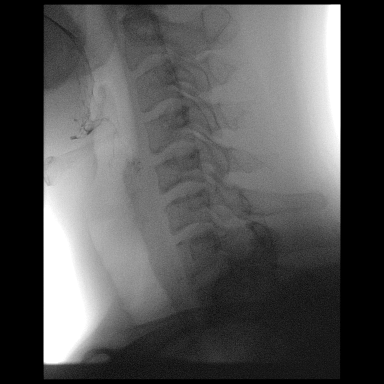
[frame 26/34]
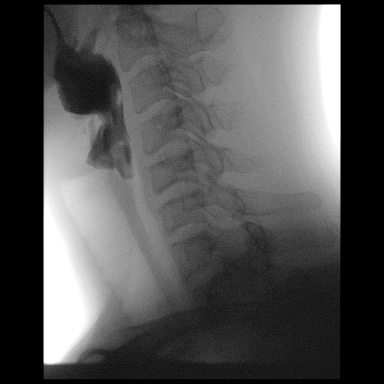
[frame 29/34]
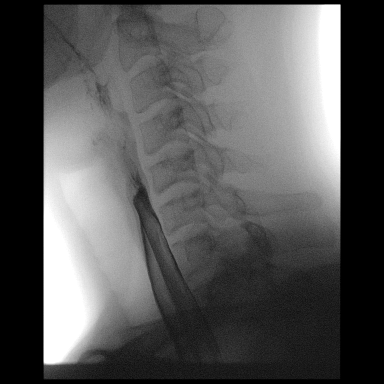

[Series 10: cp_standard · 0.34mm/px · 2 of 36 frames shown (8 of 8)]
[frame 6/36]
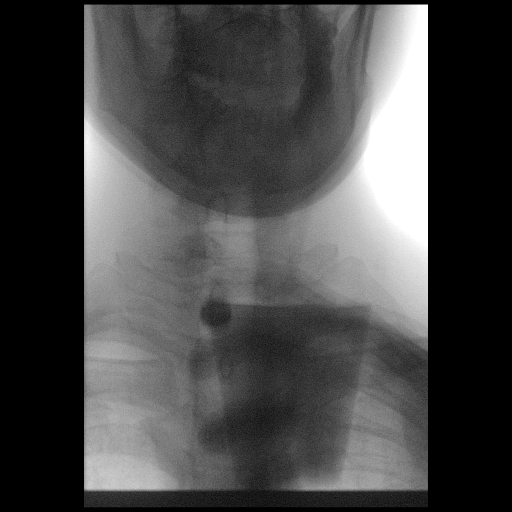
[frame 31/36]
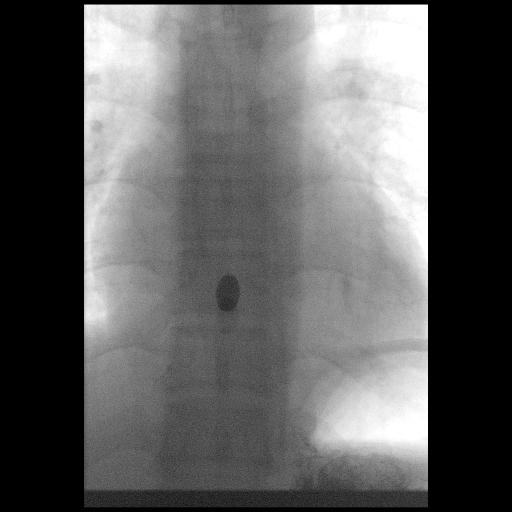

[14 of 24 positions shown; findings below may reference images not displayed]

FINDINGS: A single contrast study was undertaken and the patient tolerated
this well and without difficulty.

No obstruction to the forward flow of contrast throughout the
esophagus and into the stomach. Normal esophageal course and
contour. Normal esophageal mucosal pattern. No contrast leak or
abnormal accumulation occurred.

Normal cervical esophagus.

A 12.5 mm barium tablet was administered and passed freely to the
stomach without delay.
IMPRESSION: Normal barium esophagram.

## 2018-01-17 ENCOUNTER — Inpatient Hospital Stay
Admit: 2018-01-17 | Discharge: 2018-01-17 | Disposition: A | Discharge status: Home / Self Care | Payer: PRIVATE HEALTH INSURANCE

## 2018-01-17 ENCOUNTER — Emergency Department: Admit: 2018-01-17 | Payer: PRIVATE HEALTH INSURANCE | Primary: Family

## 2018-01-17 DIAGNOSIS — H9202 Otalgia, left ear: Secondary | ICD-10-CM

## 2018-01-17 MED ORDER — IBUPROFEN 400 MG PO TABS
400 MG | Freq: Once | ORAL | Status: AC
Start: 2018-01-17 — End: 2018-01-17
  Administered 2018-01-17: 16:00:00 800 mg via ORAL

## 2018-01-17 MED ORDER — NEOMYCIN-POLYMYXIN-HC 1 % OT SOLN
1 % | OTIC | 0 refills | Status: DC
Start: 2018-01-17 — End: 2018-04-17

## 2018-01-17 MED ORDER — IBUPROFEN 800 MG PO TABS
800 MG | ORAL_TABLET | Freq: Three times a day (TID) | ORAL | 0 refills | Status: DC | PRN
Start: 2018-01-17 — End: 2018-01-24

## 2018-01-17 MED FILL — IBUPROFEN 400 MG PO TABS: 400 mg | ORAL | Qty: 2

## 2018-01-17 NOTE — ED Triage Notes (Signed)
Pt nepali interpreter used pt states L throat pain and Ear pain for 2 days.

## 2018-01-17 NOTE — ED Provider Notes (Signed)
[facility]  EMERGENCY DEPARTMENT ENCOUNTER      Pt Name: Troy Church  VHQ:[id number]  Birthdate Aug 12, 1990  Date of evaluation: 01/17/2018  Provider: Cindee Salt, PA      Chief Complaint:    Chief Complaint   Patient presents with   ??? Otalgia     L ear pain for 2 days   ??? Pharyngitis     L side for 2 days        Nursing Notes, Past Medical Hx, Past Surgical Hx, Social Hx, Allergies, and Family Hx were all reviewed and agreed with or any disagreements were addressed in the HPI.    HPI:  (Location, Duration, Timing, Severity,Quality, Assoc Sx, Context, Modifying factors)  This is a  27 y.o. male who presents to the emergency department for evaluation of sore throat on the left side in addition to left ear pain for the last 2 days.  Patient is concerned that a small fishbone may be stuck in his ear because the symptoms seem to occur after eating fish.  He denies fever, chills, difficulty swallowing, voice changes.  States that when he inserted a Q-tip into his left ear yesterday it hurt.  Denies any swimming or immersion underwater recently.  No tinnitus.  Cough no runny nose.  No exposure to sick contacts.  His ear pain and throat pain is a 6 out of 10, sore in character.    PastMedical/Surgical History:      Diagnosis Date   ??? GERD (gastroesophageal reflux disease)      History reviewed. No pertinent surgical history.    Medications:  Previous Medications    No medications on file         Review of Systems:  Review of Systems   Constitutional: Negative for chills, fatigue and fever.   HENT: Positive for ear pain and sore throat. Negative for congestion and ear discharge.    Eyes: Negative for pain.   Respiratory: Negative for cough and shortness of breath.    Cardiovascular: Negative for chest pain.   Gastrointestinal: Negative for abdominal pain, diarrhea, nausea and vomiting.   Genitourinary: Negative for dysuria.    Musculoskeletal: Negative for back pain, neck pain and neck stiffness.   Skin: Negative for rash.   Neurological: Negative for dizziness and headaches.   Psychiatric/Behavioral: Negative for confusion.     Positives and Pertinent negatives as per HPI.  Except as noted above in the ROS, problem specific ROS was completed and is negative.    Physical Exam:  Physical Exam   Constitutional: He is oriented to person, place, and time. He appears well-developed. No distress.   HENT:   Head: Normocephalic and atraumatic.   Right Ear: Hearing, tympanic membrane and ear canal normal. No middle ear effusion.   Left Ear: Hearing and tympanic membrane normal.  No middle ear effusion.   Mouth/Throat: No oropharyngeal exudate.   Questionable erythema of the left canal. nontender with insertion of speculum.   Eyes: Right eye exhibits no discharge. Left eye exhibits no discharge.   Neck: Normal range of motion. Neck supple.   Pulmonary/Chest: No stridor. No respiratory distress.   Musculoskeletal: Normal range of motion.   Lymphadenopathy:     He has no cervical adenopathy.   Neurological: He is alert and oriented to person, place, and time.   No gross facial drooping. Moves all 4 extremities spontaneously.  Skin: Skin is warm and dry. He is not diaphoretic. No pallor.   Psychiatric: He has a normal mood and affect. His behavior is normal.   Nursing note and vitals reviewed.      MEDICAL DECISION MAKING    Vitals:    Vitals:    01/17/18 1128   BP: 126/78   Pulse: 69   Resp: 16   Temp: 97.5 ??F (36.4 ??C)   TempSrc: Oral   SpO2: 100%   Weight: 156 lb 15.5 oz (71.2 kg)       LABS:Labs Reviewed - No data to display     Remainder of labs reviewed and werenegative at this time or not returned at the time of this note.    RADIOLOGY:   Non-plain film images such as CT, Ultrasound and MRI are read by the radiologist. I, Cindee Salt, PA have directly visualized the radiologic plain film image(s) with the below  findings:        Interpretation per the Radiologist below, if available at the time of thisnote:    XR Neck Soft Tissue   Final Result   Normal soft tissue neck radiographs.              Xr Neck Soft Tissue    Result Date: 01/17/2018  EXAMINATION: TWO XRAY VIEWS OF THE NECK SOFT TISSUES 01/17/2018 11:59 am COMPARISON: None. HISTORY: ORDERING SYSTEM PROVIDED HISTORY: left sided throat pain x2 days after eating fish, concerned he has a fish bone stuck in his throat TECHNOLOGIST PROVIDED HISTORY: Reason for exam:->left sided throat pain x2 days after eating fish, concerned he has a fish bone stuck in his throat Reason for Exam: left sided throat pain x2 days after eating fish, concerned he has a fish bone stuck in his throat FINDINGS: The adenoids are not enlarged. The palatine tonsils are not enlarged. The prevertebral soft tissues are normal. The epiglottis is normal. No acute osseous abnormality. No radiopaque foreign body. Lung apices are clear.     Normal soft tissue neck radiographs.         MEDICAL DECISION MAKING / ED COURSE:      PROCEDURES:   Procedures    None    Patient was given:  Medications   ibuprofen (ADVIL;MOTRIN) tablet 800 mg (800 mg Oral Given 01/17/18 1219)       ED COURSE & MEDICAL DECISION MAKING    Pertinent Labs & Imaging studies reviewed. (See chart for details)   -  Patient seen and evaluated in the emergency department.  -  Triage and nursing notes reviewed and incorporated.  -Exam largely unremarkable, questionable erythema of the left canal.  No mastoid tenderness or erythema.  No cervical adenopathy.  Oropharynx is clear.  X-rays of the neck were negative for retained foreign body or fishbone.  Likely viral URI versus early otitis externa.  Will treat with ibuprofen and antibiotic eardrops, give PCP referral.  Instructed not to insert Q-tips.  At this time I believe patient's presentation does not warrant further workup with labs or imaging in the emergency department and is stable for  discharge home. I discussed with Troy Church and/or family the exam results, diagnosis, care, prognosis, reasons to return to the emergency department, and the importance of follow up with primary care provider in 24-48 hours. They verbalized understanding and were discharged in stable condition.  Troy Church is well appearing, non-toxic, and afebrile at the time of discharge.     The patient tolerated their  visit well.  I evaluated the patient.  The physician was available for consultation as needed.  The patient and / or the family were informed of the results of anytests, a time was given to answer questions, a plan was proposed and they agreed with plan.      CLINICAL IMPRESSION:  1. Acute otalgia, left    2. Sore throat        DISPOSITION Decision To Discharge 01/17/2018 12:34:01 PM      PATIENT REF[facility] TO:  [facility]  [address]   [place] [id number]  [phone]    If symptoms worsen    [facility]  [phone]          DISCHARGE MEDICATIONS:  New Prescriptions    IBUPROFEN (ADVIL;MOTRIN) 800 MG TABLET    Take 1 tablet by mouth every 8 hours as needed for Pain    NEOMYCIN-POLYMYXIN-HYDROCORTISONE 1 % SOLN OTIC SOLUTION    5 drops four times daily to affected ear for 7 days       DISCONTINUED MEDICATIONS:  Discontinued Medications    No medications on file              (Please note the MDM and HPI sections of this note were completed with a voice recognition program.  Efforts weremade to edit the dictations but occasionally words are mis-transcribed.)    Electronically signed, Cindee SaltGitte S Paxtyn Wisdom, PA,           Addison BaileyGitte S [place], [place]  01/17/18 1238

## 2018-01-17 NOTE — ED Notes (Signed)
D/C: Order noted for d/c. Pt confirmed d/c paperwork  have correct name. Discharge and education instructions reviewed with patient. Teach-back successful.  Pt verbalized understanding and signed d/c papers. Pt denied questions at this time. No acute distress noted. Patient instructed to follow-up as noted - return to emergency department if symptoms worsen. Patient verbalized understanding. Discharged per EDMD with discharge instructions. Pt discharged per ambulation  to private vehicle. Patient stable upon departure. [facility].          Alfredo Bachlizabeth Alfons Sulkowski, RN  01/17/18 1335

## 2018-01-23 ENCOUNTER — Emergency Department: Admit: 2018-01-24 | Payer: PRIVATE HEALTH INSURANCE | Primary: Family

## 2018-01-23 DIAGNOSIS — S161XXA Strain of muscle, fascia and tendon at neck level, initial encounter: Secondary | ICD-10-CM
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
## 2018-01-24 ENCOUNTER — Inpatient Hospital Stay
Admit: 2018-01-24 | Discharge: 2018-01-24 | Disposition: A | Discharge status: Home / Self Care | Payer: PRIVATE HEALTH INSURANCE

## 2018-01-24 MED ORDER — NAPROXEN 500 MG PO TABS
500 MG | ORAL_TABLET | Freq: Two times a day (BID) | ORAL | 0 refills | Status: DC
Start: 2018-01-24 — End: 2018-04-17

## 2018-01-24 MED ORDER — CYCLOBENZAPRINE HCL 10 MG PO TABS
10 MG | ORAL_TABLET | Freq: Three times a day (TID) | ORAL | 0 refills | Status: DC | PRN
Start: 2018-01-24 — End: 2018-04-17

## 2018-01-24 MED ORDER — CYCLOBENZAPRINE HCL 10 MG PO TABS
10 MG | Freq: Once | ORAL | Status: AC
Start: 2018-01-24 — End: 2018-01-23
  Administered 2018-01-24: 04:00:00 10 mg via ORAL

## 2018-01-24 MED ORDER — NAPROXEN 250 MG PO TABS
250 MG | Freq: Once | ORAL | Status: AC
Start: 2018-01-24 — End: 2018-01-23
  Administered 2018-01-24: 04:00:00 500 mg via ORAL

## 2018-01-24 MED FILL — CYCLOBENZAPRINE HCL 10 MG PO TABS: 10 mg | ORAL | Qty: 1

## 2018-01-24 MED FILL — NAPROXEN 250 MG PO TABS: 250 mg | ORAL | Qty: 2
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
## 2018-02-15 ENCOUNTER — Emergency Department: Admit: 2018-02-16 | Payer: PRIVATE HEALTH INSURANCE | Primary: Family

## 2018-02-15 ENCOUNTER — Inpatient Hospital Stay
Admit: 2018-02-15 | Discharge: 2018-02-16 | Disposition: A | Discharge status: Home / Self Care | Payer: PRIVATE HEALTH INSURANCE

## 2018-02-15 DIAGNOSIS — K210 Gastro-esophageal reflux disease with esophagitis: Secondary | ICD-10-CM

## 2018-02-15 NOTE — Discharge Instructions (Signed)
Laboratory studies look at liver, pancreas, kidney, urinary infection and blood infection.  These were all normal.  X-ray obtained showing no abnormalities.  We gave you GI cocktail which helped.  I will send you home with Pepcid.  Follow-up with GI physician and your physician.  Minimize aspirin and anti-inflammatory such as ibuprofen.  I recommend Tylenol.  I recommend no alcohol and no smoking.
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
## 2018-02-16 ENCOUNTER — Encounter: Payer: PRIVATE HEALTH INSURANCE | Attending: Internal Medicine | Primary: Family

## 2018-02-16 ENCOUNTER — Encounter: Attending: Internal Medicine | Primary: Family

## 2018-02-16 LAB — URINALYSIS WITH REFLEX TO CULTURE
Bilirubin Urine: NEGATIVE
Blood, Urine: NEGATIVE
Glucose, Ur: NEGATIVE mg/dL
Ketones, Urine: NEGATIVE mg/dL
Leukocyte Esterase, Urine: NEGATIVE
Nitrite, Urine: NEGATIVE
Protein, UA: NEGATIVE mg/dL
Specific Gravity, UA: 1.024 (ref 1.005–1.030)
Urobilinogen, Urine: 0.2 E.U./dL (ref <2.0)
pH, UA: 6.5 (ref 5.0–8.0)

## 2018-02-16 LAB — COMPREHENSIVE METABOLIC PANEL
ALT: 80 U/L — ABNORMAL HIGH (ref 10–40)
AST: <5 U/L — AB (ref 15–37)
Albumin/Globulin Ratio: 1.5 (ref 1.1–2.2)
Albumin: 4.7 g/dL (ref 3.4–5.0)
Alkaline Phosphatase: 97 U/L (ref 40–129)
Anion Gap: 15 (ref 3–16)
BUN: 10 mg/dL (ref 7–20)
CO2: 23 mmol/L (ref 21–32)
Calcium: 10 mg/dL (ref 8.3–10.6)
Chloride: 98 mmol/L — ABNORMAL LOW (ref 99–110)
Creatinine: 0.6 mg/dL — ABNORMAL LOW (ref 0.9–1.3)
GFR African American: >60 (ref >60)
GFR Non-African American: >60 (ref >60)
Globulin: 3.2 g/dL
Glucose: 110 mg/dL — ABNORMAL HIGH (ref 70–99)
Potassium: 4 mmol/L (ref 3.5–5.1)
Sodium: 136 mmol/L (ref 136–145)
Total Bilirubin: 0.3 mg/dL (ref 0.0–1.0)
Total Protein: 7.9 g/dL (ref 6.4–8.2)

## 2018-02-16 LAB — CBC WITH AUTO DIFFERENTIAL
Basophils %: 0.5 %
Basophils Absolute: 0 10*3/uL (ref 0.0–0.2)
Eosinophils %: 1.1 %
Eosinophils Absolute: 0.1 10*3/uL (ref 0.0–0.6)
Hematocrit: 44.2 % (ref 40.5–52.5)
Hemoglobin: 15.7 g/dL (ref 13.5–17.5)
Lymphocytes %: 44.1 %
Lymphocytes Absolute: 3.2 10*3/uL (ref 1.0–5.1)
MCH: 30.1 pg (ref 26.0–34.0)
MCHC: 35.4 g/dL (ref 31.0–36.0)
MCV: 84.9 fL (ref 80.0–100.0)
MPV: 7.6 fL (ref 5.0–10.5)
Monocytes %: 8.9 %
Monocytes Absolute: 0.6 10*3/uL (ref 0.0–1.3)
Neutrophils %: 45.4 %
Neutrophils Absolute: 3.3 10*3/uL (ref 1.7–7.7)
Platelets: 262 10*3/uL (ref 135–450)
RBC: 5.21 M/uL (ref 4.20–5.90)
RDW: 13.3 % (ref 12.4–15.4)
WBC: 7.3 10*3/uL (ref 4.0–11.0)

## 2018-02-16 LAB — LIPASE: Lipase: 38 U/L (ref 13.0–60.0)

## 2018-02-16 MED ORDER — FAMOTIDINE 20 MG PO TABS
20 MG | ORAL_TABLET | Freq: Two times a day (BID) | ORAL | 0 refills | Status: DC
Start: 2018-02-16 — End: 2018-04-17

## 2018-02-16 MED ORDER — LIDOCAINE VISCOUS HCL 2 % MT SOLN
2 % | Freq: Once | OROMUCOSAL | Status: AC
Start: 2018-02-16 — End: 2018-02-15
  Administered 2018-02-16: 02:00:00 via ORAL

## 2018-02-16 MED ORDER — FAMOTIDINE 20 MG PO TABS
20 MG | Freq: Once | ORAL | Status: AC
Start: 2018-02-16 — End: 2018-02-15
  Administered 2018-02-16: 03:00:00 40 mg via ORAL

## 2018-02-16 MED FILL — MAG-AL PLUS 200-200-20 MG/5ML PO LIQD: 200-200-20 MG/5ML | ORAL | Qty: 30

## 2018-02-16 MED FILL — FAMOTIDINE 20 MG PO TABS: 20 mg | ORAL | Qty: 2

## 2018-02-23 IMAGING — DX DG ABDOMEN 2V
2 series · 2 of 2 positions shown · non-contrast
Comparison: None.

CLINICAL DATA: Abdominal pain and nausea for 2 days.

EXAM:
ABDOMEN - 2 VIEW

[abdomen erect]
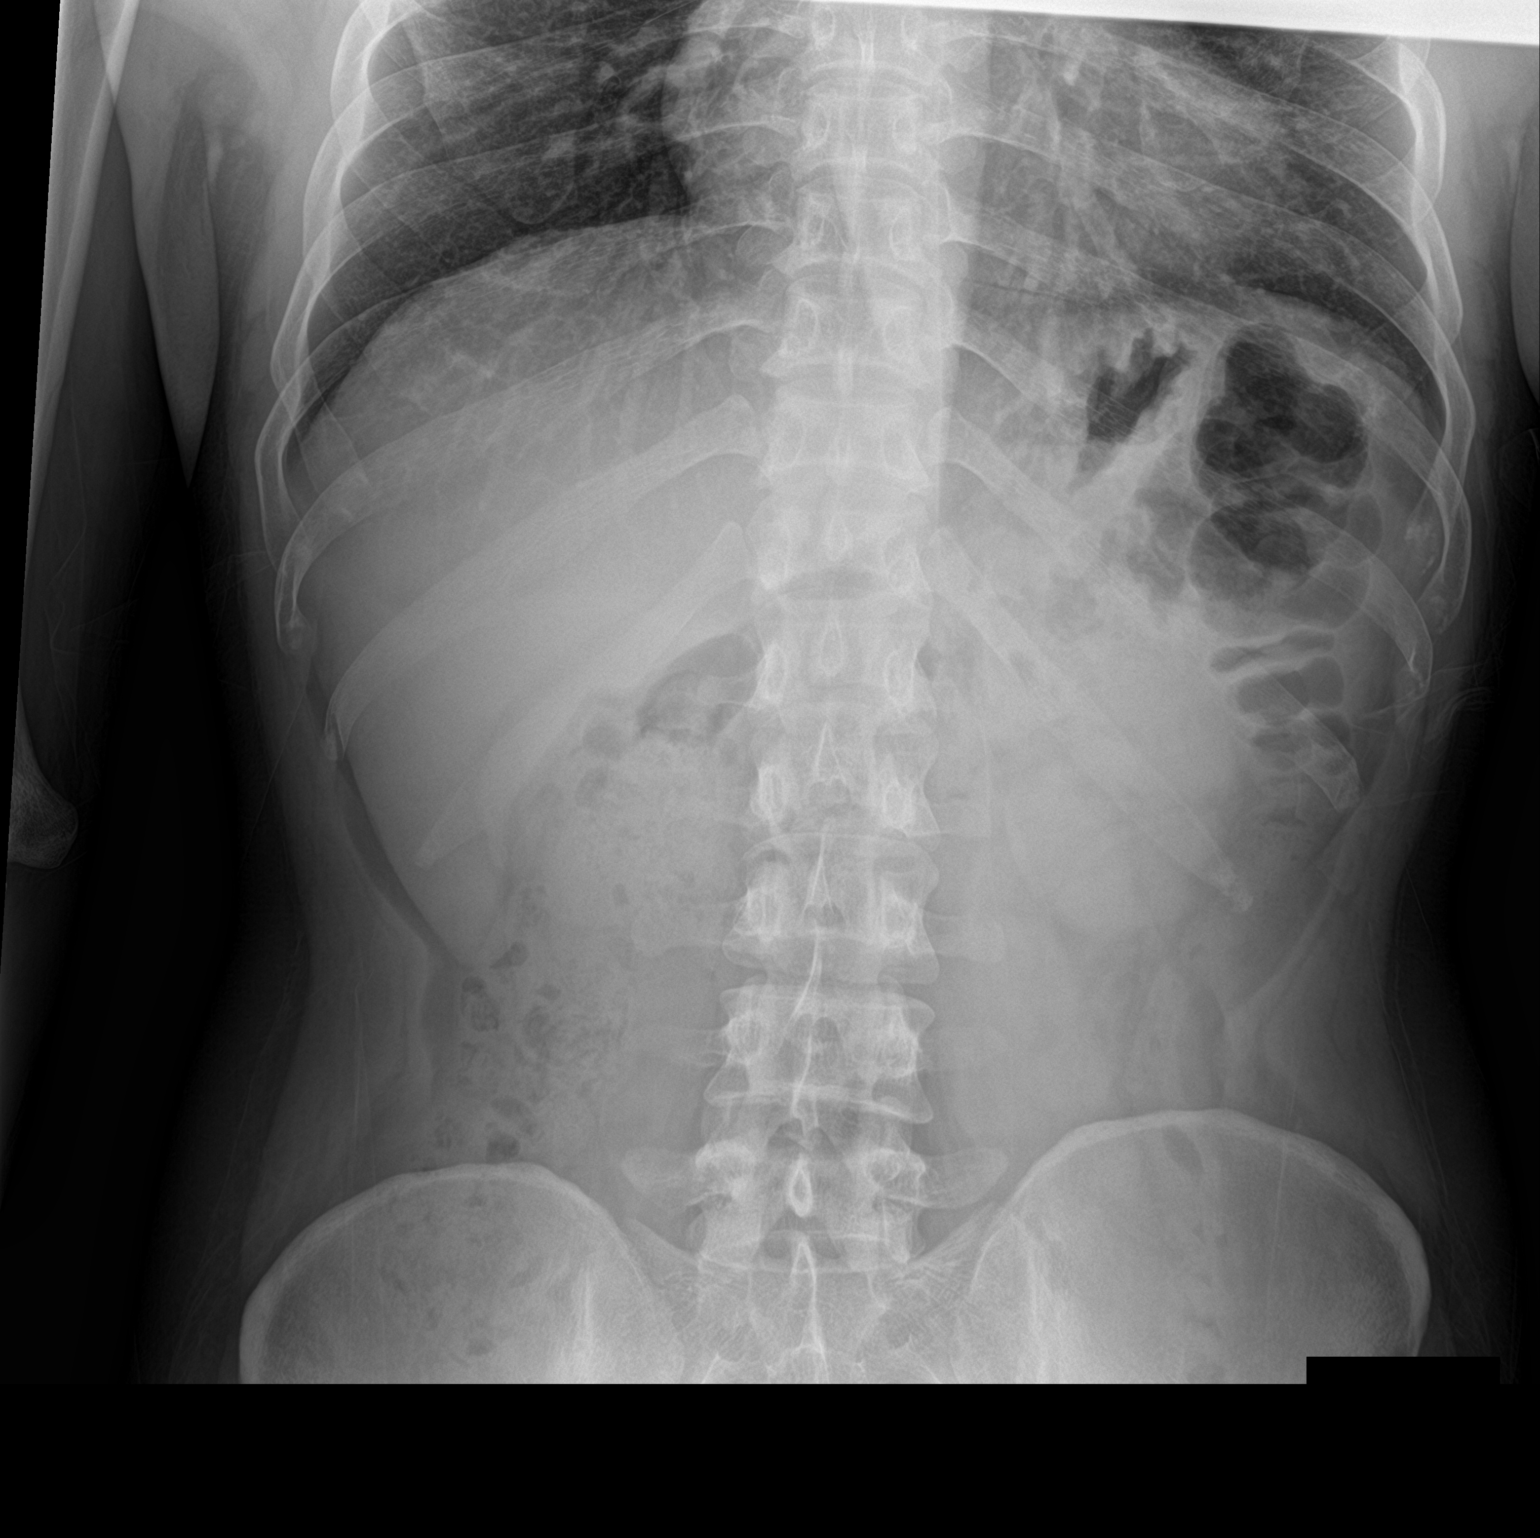

[abdomen supine]
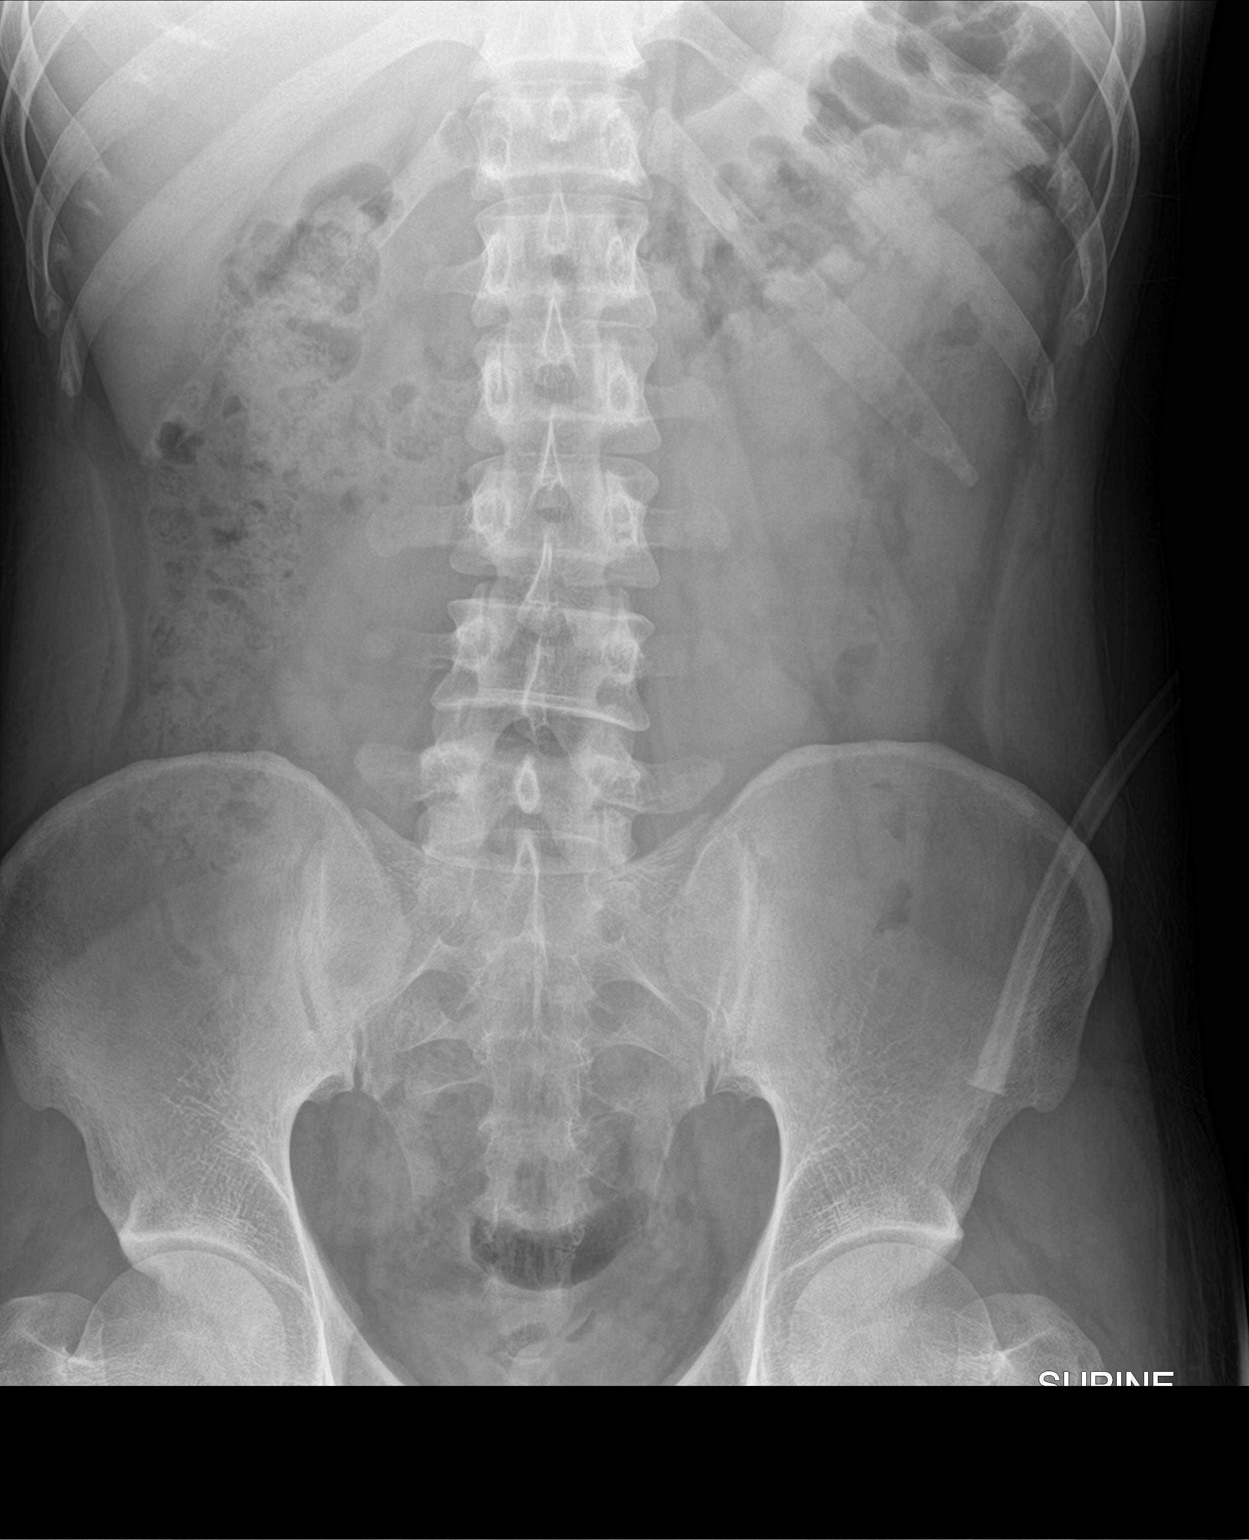

[2 of 2 positions shown; findings below may reference images not displayed]

FINDINGS: Scattered gas and stool throughout the colon. No small or large
bowel distention. No free intra-abdominal air. No abnormal air-fluid
levels. No radiopaque stones. Visualized bones appear intact.
IMPRESSION: Normal nonobstructive bowel gas pattern.

## 2018-03-23 ENCOUNTER — Encounter: Payer: PRIVATE HEALTH INSURANCE | Attending: Internal Medicine | Primary: Family

## 2018-03-25 ENCOUNTER — Encounter: Attending: Family Medicine | Primary: Family

## 2018-04-17 ENCOUNTER — Ambulatory Visit: Admit: 2018-04-17 | Discharge: 2018-04-17 | Payer: PRIVATE HEALTH INSURANCE | Attending: Family | Primary: Family

## 2018-04-17 DIAGNOSIS — R1013 Epigastric pain: Secondary | ICD-10-CM

## 2018-04-17 MED ORDER — PANTOPRAZOLE SODIUM 20 MG PO TBEC
20 | ORAL_TABLET | Freq: Every day | ORAL | 3 refills | Status: DC
Start: 2018-04-17 — End: 2018-04-17

## 2018-04-17 MED ORDER — PANTOPRAZOLE SODIUM 20 MG PO TBEC
20 MG | ORAL_TABLET | Freq: Every day | ORAL | 3 refills | Status: DC
Start: 2018-04-17 — End: 2018-05-19

## 2018-04-17 NOTE — Progress Notes (Signed)
SUBJECTIVE:    Patient ID: Troy Church is a 27 y.o. y.o. male.    HPI  Chief Complaint   Patient presents with   ??? Established New Doctor     NEW PT TO BE ESTABLISH, PT IS FASTING    NEW PT TO ESTABLISH CARE  PT C/O  GAS BUBBLE FEELS LIKE IT IS STUCK - BURNING SENSATION NOTED RANDOMLY WITH FOOD BUT PRESSURE NOTED WITHOUT EATING - HE STOPPED DRINKING THREE MONTHS AGO - WINE A BIG  BOTTLE  - PRILOSEC HELPED A LITTLE BIT  M. ADHIKARI INTERPRETER  Review of Systems   Constitutional: Negative for fever and unexpected weight change.   Gastrointestinal: Positive for abdominal pain. Negative for blood in stool, constipation and diarrhea.   All other systems reviewed and are negative.       OBJECTIVE:  Vitals:    04/17/18 0932   BP: 130/82   Pulse: 65   Resp: 16   Temp: 98.1 ??F (36.7 ??C)   SpO2: 99%     Past Medical History:   Diagnosis Date   ??? GERD (gastroesophageal reflux disease)        Physical Exam   Constitutional: He is oriented to person, place, and time. Vital signs are normal. He appears well-developed and well-nourished. He is active.  Non-toxic appearance. He does not have a sickly appearance. He does not appear ill. No distress.   Cardiovascular: Normal rate, regular rhythm, S1 normal, S2 normal and normal heart sounds.   Pulmonary/Chest: Effort normal and breath sounds normal.   Abdominal: Soft. Normal appearance and bowel sounds are normal.   Neurological: He is alert and oriented to person, place, and time. GCS eye subscore is 4. GCS verbal subscore is 5. GCS motor subscore is 6.   Psychiatric: He has a normal mood and affect. His speech is normal and behavior is normal. Judgment and thought content normal. Cognition and memory are normal.       Troy Church was seen today for established new doctor.    Diagnoses and all orders for this visit:    Epigastric pain  -     H. Pylori Breath Test, Adult; Future  -     pantoprazole (PROTONIX) 20 MG tablet; Take 1 tablet by mouth daily  -     H. Pylori Breath Test,  Adult      Gastroesophageal reflux disease, esophagitis presence not specified  -     H. Pylori Breath Test, Adult; Future       pantoprazole (PROTONIX) 20 MG tablet; Take 1 tablet by mouth daily  GERD precautions dw pt- encouraged to continue refraining from alcohol

## 2018-04-17 NOTE — Patient Instructions (Signed)
Patient Education        Gastroesophageal Reflux Disease (GERD): Care Instructions  Your Care Instructions    Gastroesophageal reflux disease (GERD) is the backward flow of stomach acid into the esophagus. The esophagus is the tube that leads from your throat to your stomach. A one-way valve prevents the stomach acid from moving up into this tube. When you have GERD, this valve does not close tightly enough.  If you have mild GERD symptoms including heartburn, you may be able to control the problem with antacids or over-the-counter medicine. Changing your diet, losing weight, and making other lifestyle changes can also help reduce symptoms.  Follow-up care is a key part of your treatment and safety. Be sure to make and go to all appointments, and call your doctor if you are having problems. It's also a good idea to know your test results and keep a list of the medicines you take.  How can you care for yourself at home?  ?? Take your medicines exactly as prescribed. Call your doctor if you think you are having a problem with your medicine.  ?? Your doctor may recommend over-the-counter medicine. For mild or occasional indigestion, antacids, such as Tums, Gaviscon, Mylanta, or Maalox, may help. Your doctor also may recommend over-the-counter acid reducers, such as Pepcid AC, Tagamet HB, Zantac 75, or Prilosec. Read and follow all instructions on the label. If you use these medicines often, talk with your doctor.  ?? Change your eating habits.  ? It's best to eat several small meals instead of two or three large meals.  ? After you eat, wait 2 to 3 hours before you lie down.  ? Chocolate, mint, and alcohol can make GERD worse.  ? Spicy foods, foods that have a lot of acid (like tomatoes and oranges), and coffee can make GERD symptoms worse in some people. If your symptoms are worse after you eat a certain food, you may want to stop eating that food to see if your symptoms get better.  ?? Do not smoke or chew tobacco.  Smoking can make GERD worse. If you need help quitting, talk to your doctor about stop-smoking programs and medicines. These can increase your chances of quitting for good.  ?? If you have GERD symptoms at night, raise the head of your bed 6 to 8 inches by putting the frame on blocks or placing a foam wedge under the head of your mattress. (Adding extra pillows does not work.)  ?? Do not wear tight clothing around your middle.  ?? Lose weight if you need to. Losing just 5 to 10 pounds can help.  When should you call for help?  Call your doctor now or seek immediate medical care if:  ?? ?? You have new or different belly pain.   ?? ?? Your stools are black and tarlike or have streaks of blood.   ??Watch closely for changes in your health, and be sure to contact your doctor if:  ?? ?? Your symptoms have not improved after 2 days.   ?? ?? Food seems to catch in your throat or chest.   Where can you learn more?  Go to [email or web address] and sign in to your MyChart account. Enter T927 in the Search Health Information box to learn more about "Gastroesophageal Reflux Disease (GERD): Care Instructions."     If you do not have an account, please click on the "Sign Up Now" link.  Current as of: April 16, 2017  Content Version:   12.1  ?? 2006-2019 [employer]. Care instructions adapted under license by [facility]. If you have questions about a medical condition or this instruction, always ask your healthcare professional. [employer] disclaims any warranty or liability for your use of this information.

## 2018-04-24 ENCOUNTER — Telehealth

## 2018-04-24 NOTE — Telephone Encounter (Signed)
Received Fax from [facility] stating that [place] GullyBreathTek could not be resulted due to Breath bags were not collected according to the instructions.  Test was rejected, fax placed in scanning./mv

## 2018-04-27 NOTE — Telephone Encounter (Signed)
CALLED PT AND LVM TO CALL OFFICE.MA

## 2018-04-27 NOTE — Telephone Encounter (Signed)
Troy Church - can you call the pt and tell him we need to redo this test- Thanks

## 2018-05-12 ENCOUNTER — Encounter: Payer: PRIVATE HEALTH INSURANCE | Primary: Family

## 2018-05-12 NOTE — Telephone Encounter (Signed)
Pt informed, scheduled for today at 1030./mv

## 2018-05-13 ENCOUNTER — Encounter: Admit: 2018-05-13 | Discharge: 2018-05-13 | Payer: PRIVATE HEALTH INSURANCE | Primary: Family

## 2018-05-13 DIAGNOSIS — R1013 Epigastric pain: Secondary | ICD-10-CM

## 2018-05-13 NOTE — Progress Notes (Signed)
Pt here to repeat Breath Tek test.      Baseline: 0907  Post dose: 0923    mv

## 2018-05-13 NOTE — Telephone Encounter (Signed)
Pt was in today for repeat Breath Tek and states that the Prilosec is not working.  Wants to know if we can call in Omeprazole for him to [facility] on [address].    Per Westley Hummerharlene she will send medication based on Breath Tek results.      Pt informed that we will call him with results and let him know if Omeprazole is appropriate./mv

## 2018-05-15 LAB — H. PYLORI BREATH TEST, ADULT: H Pylori Breath Test: POSITIVE — AB

## 2018-05-19 MED ORDER — AMOXICILLIN 500 MG PO CAPS
500 MG | ORAL_CAPSULE | Freq: Two times a day (BID) | ORAL | 0 refills | Status: DC
Start: 2018-05-19 — End: 2018-07-21

## 2018-05-19 MED ORDER — OMEPRAZOLE 20 MG PO CPDR
20 MG | ORAL_CAPSULE | Freq: Two times a day (BID) | ORAL | 1 refills | Status: DC
Start: 2018-05-19 — End: 2018-07-21

## 2018-05-19 MED ORDER — CLARITHROMYCIN 500 MG PO TABS
500 MG | ORAL_TABLET | Freq: Two times a day (BID) | ORAL | 0 refills | Status: DC
Start: 2018-05-19 — End: 2018-07-21

## 2018-05-22 ENCOUNTER — Encounter: Payer: PRIVATE HEALTH INSURANCE | Attending: Family | Primary: Family

## 2018-06-05 ENCOUNTER — Emergency Department: Admit: 2018-06-05 | Payer: PRIVATE HEALTH INSURANCE | Primary: Family

## 2018-06-05 ENCOUNTER — Inpatient Hospital Stay: Admit: 2018-06-05 | Discharge: 2018-06-05 | Payer: PRIVATE HEALTH INSURANCE

## 2018-06-05 DIAGNOSIS — R509 Fever, unspecified: Secondary | ICD-10-CM

## 2018-06-05 LAB — URINALYSIS WITH REFLEX TO CULTURE
Bilirubin Urine: NEGATIVE
Blood, Urine: NEGATIVE
Glucose, Ur: NEGATIVE mg/dL
Ketones, Urine: NEGATIVE mg/dL
Leukocyte Esterase, Urine: NEGATIVE
Nitrite, Urine: NEGATIVE
Protein, UA: NEGATIVE mg/dL
Specific Gravity, UA: 1.014 (ref 1.005–1.030)
Urobilinogen, Urine: 0.2 E.U./dL (ref <2.0)
pH, UA: 8 (ref 5.0–8.0)

## 2018-06-05 LAB — BASIC METABOLIC PANEL W/ REFLEX TO MG FOR LOW K
Anion Gap: 16 (ref 3–16)
BUN: 9 mg/dL (ref 7–20)
CO2: 20 mmol/L — ABNORMAL LOW (ref 21–32)
Calcium: 9.8 mg/dL (ref 8.3–10.6)
Chloride: 100 mmol/L (ref 99–110)
Creatinine: 0.8 mg/dL — ABNORMAL LOW (ref 0.9–1.3)
GFR African American: >60 (ref >60)
GFR Non-African American: >60 (ref >60)
Glucose: 97 mg/dL (ref 70–99)
Potassium reflex Magnesium: 4 mmol/L (ref 3.5–5.1)
Sodium: 136 mmol/L (ref 136–145)

## 2018-06-05 LAB — CBC WITH AUTO DIFFERENTIAL
Basophils %: 0.2 %
Basophils Absolute: 0 10*3/uL (ref 0.0–0.2)
Eosinophils %: 0.1 %
Eosinophils Absolute: 0 10*3/uL (ref 0.0–0.6)
Hematocrit: 46.5 % (ref 40.5–52.5)
Hemoglobin: 16.1 g/dL (ref 13.5–17.5)
Lymphocytes %: 11.9 %
Lymphocytes Absolute: 1.9 10*3/uL (ref 1.0–5.1)
MCH: 29.2 pg (ref 26.0–34.0)
MCHC: 34.5 g/dL (ref 31.0–36.0)
MCV: 84.6 fL (ref 80.0–100.0)
MPV: 8.1 fL (ref 5.0–10.5)
Monocytes %: 6.9 %
Monocytes Absolute: 1.1 10*3/uL (ref 0.0–1.3)
Neutrophils %: 80.9 %
Neutrophils Absolute: 13.1 10*3/uL — ABNORMAL HIGH (ref 1.7–7.7)
Platelets: 235 10*3/uL (ref 135–450)
RBC: 5.5 M/uL (ref 4.20–5.90)
RDW: 12.8 % (ref 12.4–15.4)
WBC: 16.2 10*3/uL — ABNORMAL HIGH (ref 4.0–11.0)

## 2018-06-05 LAB — RAPID INFLUENZA A/B ANTIGENS
Rapid Influenza A Ag: NEGATIVE
Rapid Influenza B Ag: NEGATIVE

## 2018-06-05 LAB — LACTIC ACID: Lactic Acid: 1.6 mmol/L (ref 0.4–2.0)

## 2018-06-09 LAB — CULTURE BLOOD #1: Blood Culture, Routine: NO GROWTH

## 2018-06-19 ENCOUNTER — Encounter: Payer: PRIVATE HEALTH INSURANCE | Attending: Family | Primary: Family

## 2018-06-29 NOTE — Telephone Encounter (Signed)
PT @  [phone]      RE: HIS STOMACHE  MEDICATION - HE JUST KEPT REPEATING HE NEEDS TO SPEAK TO HIS DOCTOR -

## 2018-06-29 NOTE — Telephone Encounter (Signed)
MEENA CAN YOU CALL THIS PATIENT TO SEE WHAT HE NEEDS PLEASE. SC

## 2018-06-29 NOTE — Telephone Encounter (Signed)
CALLED AND SPOKED WITH PT, PT HAD SOME CONFUSION WITH HIS MED'S AND NEEDS APPT FOR PHYSICAL, I SCHEDULED APPT FOR PHYSICAL ON 07/21/18 . MA

## 2018-07-21 ENCOUNTER — Ambulatory Visit: Admit: 2018-07-21 | Discharge: 2018-07-21 | Payer: PRIVATE HEALTH INSURANCE | Attending: Family | Primary: Family

## 2018-07-21 DIAGNOSIS — Z0000 Encounter for general adult medical examination without abnormal findings: Secondary | ICD-10-CM

## 2018-07-21 MED ORDER — CLARITHROMYCIN 500 MG PO TABS
500 MG | ORAL_TABLET | Freq: Two times a day (BID) | ORAL | 0 refills | Status: AC
Start: 2018-07-21 — End: 2018-08-04

## 2018-07-21 MED ORDER — POLYMYXIN B-TRIMETHOPRIM 10000-0.1 UNIT/ML-% OP SOLN
OPHTHALMIC | 0 refills | Status: AC
Start: 2018-07-21 — End: 2018-07-31

## 2018-07-21 MED ORDER — AMOXICILLIN 500 MG PO CAPS
500 MG | ORAL_CAPSULE | Freq: Two times a day (BID) | ORAL | 0 refills | Status: AC
Start: 2018-07-21 — End: 2018-08-04

## 2018-07-21 NOTE — Progress Notes (Signed)
History and Physical      Troy Church  Date of Birth:  06/05/1991    Date of Service:  07/21/2018    Chief Complaint:   Troy BruinsDal Mcbane is a 28 y.o. male who presents for complete physical examination.    HPI:  PT PRESENTS FOR ANNUAL EXAM- HE HAS NOT STARTED THE ABX  FOR HIS H PYLORI BECAUSE THE PHARMACY DID NOT HAVE IT- HE IS TAKING THE PROTONIX  HE IS NOT FASTING TODAY    Wt Readings from Last 3 Encounters:   04/17/18 169 lb 12.8 oz (77 kg)   01/23/18 159 lb 6.3 oz (72.3 kg)   01/17/18 156 lb 15.5 oz (71.2 kg)     BP Readings from Last 3 Encounters:   06/05/18 (!) 104/56   04/17/18 130/82   02/15/18 118/75       There is no problem list on file for this patient.      No Known Allergies  No outpatient medications have been marked as taking for the 07/21/18 encounter (Appointment) with Constance Holsterharlene Lisa Donzel Romack, APRN - CNP.       Past Medical History:   Diagnosis Date   ??? GERD (gastroesophageal reflux disease)      No past surgical history on file.  Family History   Problem Relation Age of Onset   ??? No Known Problems Mother    ??? No Known Problems Father    ??? No Known Problems Sister    ??? No Known Problems Brother    ??? No Known Problems Brother      Social History     Socioeconomic History   ??? Marital status: Single     Spouse name: Not on file   ??? Number of children: Not on file   ??? Years of education: Not on file   ??? Highest education level: Not on file   Occupational History   ??? Not on file   Social Needs   ??? Financial resource strain: Not on file   ??? Food insecurity:     Worry: Not on file     Inability: Not on file   ??? Transportation needs:     Medical: Not on file     Non-medical: Not on file   Tobacco Use   ??? Smoking status: Current Every Day Smoker     Packs/day: 0.50     Types: Cigarettes     Start date: 04/17/2005   ??? Smokeless tobacco: Never Used   Substance and Sexual Activity   ??? Alcohol use: Not Currently   ??? Drug use: Not Currently   ??? Sexual activity: Yes     Partners: Female   Lifestyle   ??? Physical activity:     Days  per week: Not on file     Minutes per session: Not on file   ??? Stress: Not on file   Relationships   ??? Social connections:     Talks on phone: Not on file     Gets together: Not on file     Attends religious service: Not on file     Active member of club or organization: Not on file     Attends meetings of clubs or organizations: Not on file     Relationship status: Not on file   ??? Intimate partner violence:     Fear of current or ex partner: Not on file     Emotionally abused: Not on file     Physically abused:  Not on file     Forced sexual activity: Not on file   Other Topics Concern   ??? Not on file   Social History Narrative   ??? Not on file       Review of Systems:  A comprehensive review of systems was negative except for: Gastrointestinal: positive for abdominal pain and reflux symptoms     Physical Exam:   There were no vitals filed for this visit.  There is no height or weight on file to calculate BMI.  Constitutional: He is oriented to person, place, and time. He appears well-developed and well-nourished. No distress.   HEENT:   Head: Normocephalic and atraumatic.   Right Ear: Tympanic membrane, external ear and ear canal normal.   Left Ear: Tympanic membrane, external ear and ear canal normal.   Mouth/Throat: Oropharynx is clear and moist and mucous membranes are normal. No oropharyngeal exudate or posterior oropharyngeal erythema. He has no cervical adenopathy.   Eyes: Conjunctivae and extraocular motions are normal. Pupils are equal, round, and reactive to light.   Neck:  Supple. No JVD present. Carotid bruit is not present. No mass and no thyromegaly present.   Cardiovascular: Normal rate, regular rhythm, normal heart sounds and intact distal pulses.  Exam reveals no gallop and no friction rub. No murmur heard.  Pulmonary/Chest: Effort normal and breath sounds normal. No respiratory distress. He has no wheezes, rhonchi or rales.   Abdominal: Soft, non-tender. Bowel sounds and aorta are normal. There is  no organomegaly, mass or bruit.   Musculoskeletal: Normal range of motion, no synovitis. He exhibits no edema.   Neurological: He is alert and oriented to person, place, and time. He has normal reflexes. No cranial nerve deficit. Coordination normal.   Skin: Skin is warm, dry and intact.  No suspicious lesions are noted.  Psychiatric: He has a normal mood and affect. His speech is normal and behavior is normal. Judgment, cognition and memory are normal.     Preventive Care:  Health Maintenance   Topic Date Due   ??? Varicella vaccine (1 of 2 - 2-dose childhood series) 03/11/1991   ??? Pneumococcal 0-64 years Vaccine (1 of 1 - PPSV23) 03/10/1996   ??? DTaP/Tdap/Td vaccine (1 - Tdap) 03/10/2001   ??? HIV screen  03/10/2005   ??? Flu vaccine (1) 02/08/2018   ??? Shingles Vaccine (1 of 2) 03/10/2040   ??? Hepatitis A vaccine  Aged Out   ??? Hepatitis B vaccine  Aged Out   ??? Hib vaccine  Aged Out   ??? Meningococcal (ACWY) vaccine  Aged Out      Self-testicular exams: Yes  Sexual activity: single partner, contraception - OCP (estrogen/progesterone)   Last eye exam: 2013, NO CONCERNS   Exercise: no regular exercise  Seatbelt use: YES       Preventive plan of care for Ysabel Emminger        07/21/2018           Preventive Measures Status       Recommendations for screening   Prostate Cancer Screen  No results found for: PSA   This test is not clinically indicated    Colon Cancer Screen  Last colonoscopy: N/A This test is not clinically indicated   Diabetes Screen  Glucose (mg/dL)   Date Value   91/47/8295 97    Test recommended and ordered   Cholesterol Screen  No results found for: CHOL, TRIG, HDL, LDLCALC, LDLDIRECT Test recommended and ordered  Aspirin for Cardiovascular Prevention   No Not indicated   Weight: There is no height or weight on file to calculate BMI.      Your BMI is 25 or greater, which indicates that you are overweight   Living Will: No Patient declined    Recommended Immunizations      There is no immunization history on file  for this patient. Influenza vaccine:  ADVISED TO HAVE ADMINISTERED AT THE PHARMACY         Other Recommendations ??   See an eye specialist every 1 years  ?? See a dentist every 6 months  ?? You should stop all tobacco use- tobacco cessation resources provided  ?? When exposed to the sun, use a sunscreen that protects against both UVA and UVB radiation with an SPF of 30 or greater- reapply every 2 to 3 hours or after sweating, drying off with a towel, or swimming  ?? You need 1200-1500 mg of calcium and 1000-2000 IU of vitamin D per day- it is possible to meet your calcium requirement with diet alone, but a vitamin D supplement is usually necessary  ?? Have your blood pressure checked at least once every year             Ibrahima was seen today for annual exam.    Diagnoses and all orders for this visit:    Annual physical exam  WELLNESS RECOMMENDATIONS REVIEWED WITH PT  ?? See an eye specialist every 1 years  ?? See a dentist every 6 months  ?? You should stop all tobacco use- tobacco cessation resources provided  ?? When exposed to the sun, use a sunscreen that protects against both UVA and UVB radiation with an SPF of 30 or greater- reapply every 2 to 3 hours or after sweating, drying off with a towel, or swimming  ?? You need 1200-1500 mg of calcium and 1000-2000 IU of vitamin D per day- it is possible to meet your calcium requirement with diet alone, but a vitamin D supplement is usually necessary  ?? Have your blood pressure checked at least once every year           Conjunctivitis of left eye, unspecified conjunctivitis type  -     trimethoprim-polymyxin b (POLYTRIM) 10000-0.1 UNIT/ML-% ophthalmic solution; Place 1 drop into the left eye every 4 hours for 10 days  COLD COMPRESSES TO LEFT EYE    Encounter for screening for HIV  -     HIV-1 AND HIV-2 ANTIBODIES; Future    Lipid screening  -     LIPID PANEL; Future    Diabetes mellitus screening  -     COMPREHENSIVE METABOLIC PANEL; Future    H PYLORI  -     clarithromycin  (BIAXIN) 500 MG tablet; Take 1 tablet by mouth 2 times daily for 14 days  -     amoxicillin (AMOXIL) 500 MG capsule; Take 2 capsules by mouth 2 times daily for 14 days

## 2018-07-21 NOTE — Patient Instructions (Addendum)
Patient Education        Well Visit, Ages 18 to 50: Care Instructions  Your Care Instructions    Physical exams can help you stay healthy. Your doctor has checked your overall health and may have suggested ways to take good care of yourself. He or she also may have recommended tests. At home, you can help prevent illness with healthy eating, regular exercise, and other steps.  Follow-up care is a key part of your treatment and safety. Be sure to make and go to all appointments, and call your doctor if you are having problems. It's also a good idea to know your test results and keep a list of the medicines you take.  How can you care for yourself at home?  ?? Reach and stay at a healthy weight. This will lower your risk for many problems, such as obesity, diabetes, heart disease, and high blood pressure.  ?? Get at least 30 minutes of physical activity on most days of the week. Walking is a good choice. You also may want to do other activities, such as running, swimming, cycling, or playing tennis or team sports. Discuss any changes in your exercise program with your doctor.  ?? Do not smoke or allow others to smoke around you. If you need help quitting, talk to your doctor about stop-smoking programs and medicines. These can increase your chances of quitting for good.  ?? Talk to your doctor about whether you have any risk factors for sexually transmitted infections (STIs). Having one sex partner (who does not have STIs and does not have sex with anyone else) is a good way to avoid these infections.  ?? Use birth control if you do not want to have children at this time. Talk with your doctor about the choices available and what might be best for you.  ?? Protect your skin from too much sun. When you're outdoors from 10 a.m. to 4 p.m., stay in the shade or cover up with clothing and a hat with a wide brim. Wear sunglasses that block UV rays. Even when it's cloudy, put broad-spectrum sunscreen (SPF 30 or higher) on any  exposed skin.  ?? See a dentist one or two times a year for checkups and to have your teeth cleaned.  ?? Wear a seat belt in the car.  Follow your doctor's advice about when to have certain tests. These tests can spot problems early.  For everyone  ?? Cholesterol. Have the fat (cholesterol) in your blood tested after age 20. Your doctor will tell you how often to have this done based on your age, family history, or other things that can increase your risk for heart disease.  ?? Blood pressure. Have your blood pressure checked during a routine doctor visit. Your doctor will tell you how often to check your blood pressure based on your age, your blood pressure results, and other factors.  ?? Vision. Talk with your doctor about how often to have a glaucoma test.  ?? Diabetes. Ask your doctor whether you should have tests for diabetes.  ?? Colon cancer. Your risk for colorectal cancer gets higher as you get older. Some experts say that adults should start regular screening at age 50 and stop at age 75. Others say to start before age 50 or continue after age 75. Talk with your doctor about your risk and when to start and stop screening.  For women  ?? Breast exam and mammogram. Talk to your doctor about when   you should have a clinical breast exam and a mammogram. Medical experts differ on whether and how often women under 50 should have these tests. Your doctor can help you decide what is right for you.  ?? Cervical cancer screening test and pelvic exam. Begin with a Pap test at age 59. The test often is part of a pelvic exam. Starting at age 65, you may choose to have a Pap test, an HPV test, or both tests at the same time (called co-testing). Talk with your doctor about how often to have testing.  ?? Tests for sexually transmitted infections (STIs). Ask whether you should have tests for STIs. You may be at risk if you have sex with more than one person, especially if your partners do not wear condoms.  For men  ?? Tests for  sexually transmitted infections (STIs). Ask whether you should have tests for STIs. You may be at risk if you have sex with more than one person, especially if you do not wear a condom.  ?? Testicular cancer exam. Ask your doctor whether you should check your testicles regularly.  ?? Prostate exam. Talk to your doctor about whether you should have a blood test (called a PSA test) for prostate cancer. Experts differ on whether and when men should have this test. Some experts suggest it if you are older than 20 and are African-American or have a father or brother who got prostate cancer when he was younger than 30.  When should you call for help?  Watch closely for changes in your health, and be sure to contact your doctor if you have any problems or symptoms that concern you.  Where can you learn more?  Go to [email or web address] and sign in to your MyChart account. Enter P072 in the Search Health Information box to learn more about "Well Visit, Ages 61 to 49: Care Instructions."     If you do not have an account, please click on the "Sign Up Now" link.  Current as of: January 28, 2018  Content Version: 12.3  ?? 2006-2019 [employer]. Care instructions adapted under license by [facility]. If you have questions about a medical condition or this instruction, always ask your healthcare professional. [employer], Inc[employer] disclaims any warranty or liability for your use of this information.         Patient Education        Pinkeye: Care Instructions  Your Care Instructions    Pinkeye is redness and swelling of the eye surface and the conjunctiva (the lining of the eyelid and the covering of the white part of the eye). Pinkeye is also called conjunctivitis. Pinkeye is often caused by infection with bacteria or a virus. Dry air, allergies, smoke, and chemicals are other common causes.  Pinkeye often clears on its own in 7 to 10 days. Antibiotics only help if the pinkeye is caused by  bacteria. Pinkeye caused by infection spreads easily. If an allergy or chemical is causing pinkeye, it will not go away unless you can avoid whatever is causing it.  Follow-up care is a key part of your treatment and safety. Be sure to make and go to all appointments, and call your doctor if you are having problems. It's also a good idea to know your test results and keep a list of the medicines you take.  How can you care for yourself at home?  ?? Wash your hands often. Always wash them before and after you treat pinkeye or touch  your eyes or face.  ?? Use moist cotton or a clean, wet cloth to remove crust. Wipe from the inside corner of the eye to the outside. Use a clean part of the cloth for each wipe.  ?? Put cold or warm wet cloths on your eye a few times a day if the eye hurts.  ?? Do not wear contact lenses or eye makeup until the pinkeye is gone. Throw away any eye makeup you were using when you got pinkeye. Clean your contacts and storage case. If you wear disposable contacts, use a new pair when your eye has cleared and it is safe to wear contacts again.  ?? If the doctor gave you antibiotic ointment or eyedrops, use them as directed. Use the medicine for as long as instructed, even if your eye starts looking better soon. Keep the bottle tip clean, and do not let it touch the eye area.  ?? To put in eyedrops or ointment:  ? Tilt your head back, and pull your lower eyelid down with one finger.  ? Drop or squirt the medicine inside the lower lid.  ? Close your eye for 30 to 60 seconds to let the drops or ointment move around.  ? Do not touch the ointment or dropper tip to your eyelashes or any other surface.  ?? Do not share towels, pillows, or washcloths while you have pinkeye.  When should you call for help?  Call your doctor now or seek immediate medical care if:  ?? ?? You have pain in your eye, not just irritation on the surface.   ?? ?? You have a change in vision or loss of vision.   ?? ?? You have an increase in  discharge from the eye.   ?? ?? Your eye has not started to improve or begins to get worse within 48 hours after you start using antibiotics.   ?? ?? Pinkeye lasts longer than 7 days.   ??Watch closely for changes in your health, and be sure to contact your doctor if you have any problems.  Where can you learn more?  Go to [email or web address] and sign in to your MyChart account. Enter Y392 in the Search Health Information box to learn more about "Pinkeye: Care Instructions."     If you do not have an account, please click on the "Sign Up Now" link.  Current as of: December 03, 2017  Content Version: 12.3  ?? 2006-2019 [employer]. Care instructions adapted under license by [facility]. If you have questions about a medical condition or this instruction, always ask your healthcare professional. [employer], [employer] disclaims any warranty or liability for your use of this information.         Patient Education        H. Pylori: About This Test  What is it?  H. pylori tests are used to check for a Helicobacter pylori bacteria infection in the stomach and upper part of the small intestine. H. pylori can cause peptic ulcers. But most people with this type of bacteria in their digestive systems do not get ulcers.  Different tests may be used to check for an H. pylori infection.  ?? Blood antibody test. This checks to see if your body has made antibodies to fight H. pylori bacteria.  ?? Urea breath test. It tests your breath to see if you have H. pylori bacteria in your stomach.  ?? Stool antigen test. This test looks for substances in your feces (stool) that  trigger the immune system to fight an H. pylori infection. (These substances are called H. pylori antigens.)  ?? Stomach biopsy. A small sample (biopsy) is taken from the lining of your stomach and small intestine. The samples are checked for H. pylori.  Why is this test done?  H. pylori tests are done to:  ?? Find out if an infection with H.  pylori bacteria may be causing an ulcer or irritation of the stomach lining (gastritis).  ?? Find out if treatment for the infection worked.  How can you prepare for the test?  Blood antibody test  ?? You do not need to do anything before you have this test.  Urea breath test, stool antigen test, or stomach biopsy  ?? Medicines you take may change the results of these tests. Be sure to tell your doctor about all the prescription and over-the-counter medicines you take. Your doctor may advise you to stop taking some of your medicines.  Urea breath test or stomach biopsy  ?? You will be asked to not eat or drink anything for a certain amount of time before your breath test or stomach biopsy. Follow your doctor's instructions about how long you need to avoid eating and drinking before the test. If you are going to have a stomach biopsy, your doctor will give you instructions on how to prepare.  What happens during the test?  Blood antibody test  ?? A health professional takes a sample of your blood.  Urea breath test  A breath sample is collected when you blow into a balloon or blow bubbles into a bottle of liquid. The health professional will:  ?? Collect a sample of your breath before the test starts.  ?? Give you a capsule or some water to swallow that contains tagged or radioactive material.  ?? Collect more samples of your breath. The samples will be tested to see if they contain material formed when H. pylori comes into contact with the tagged or radioactive material.  Stool antigen test  For this test, you may be asked to collect the stool sample at home. To collect the sample, you need to:  ?? Pass stool into a dry container. Either solid or liquid stools can be collected. Be careful not to get urine or toilet tissue in with the stool sample.  ?? Replace the container cap. Label the container with your name, your doctor's name, and the date the sample was collected.  ?? Wash your hands well after you collect the  sample.  ?? Take the sealed container to your doctor's office or to the lab as soon as you can.  Stomach biopsy  ?? A procedure called endoscopy is used to collect samples of tissue from the stomach and the first part of the small intestine. The tissue samples are tested in a lab to see if they contain H. pylori.  Follow-up care is a key part of your treatment and safety. Be sure to make and go to all appointments, and call your doctor if you are having problems. It's also a good idea to keep a list of the medicines you take. Ask your doctor when you can expect to have your test results.  Where can you learn more?  Go to [email or web address] and sign in to your MyChart account. Enter [phone] in the Search Health Information box to learn more about "H. Pylori: About This Test."     If you do not have an account, please click on the "Sign  Up Now" link.  Current as of: September 04, 2017  Content Version: 12.3  ?? 2006-2019 [employer], [employer]. Care instructions adapted under license by [facility]. If you have questions about a medical condition or this instruction, always ask your healthcare professional. [employer] disclaims any warranty or liability for your use of this information.         Patient Education        Stopping Smoking: Care Instructions  Your Care Instructions  Cigarette smokers crave the nicotine in cigarettes. Giving it up is much harder than simply changing a habit. Your body has to stop craving the nicotine. It is hard to quit, but you can do it. There are many tools that people use to quit smoking. You may find that combining tools works best for you.  There are several steps to quitting. First you get ready to quit. Then you get support to help you. After that, you learn new skills and behaviors to become a nonsmoker. For many people, a necessary step is getting and using medicine.  Your doctor will help you set up the plan that best meets your needs. You may want to  attend a smoking cessation program to help you quit smoking. When you choose a program, look for one that has proven success. Ask your doctor for ideas. You will greatly increase your chances of success if you take medicine as well as get counseling or join a cessation program.  Some of the changes you feel when you first quit tobacco are uncomfortable. Your body will miss the nicotine at first, and you may feel short-tempered and grumpy. You may have trouble sleeping or concentrating. Medicine can help you deal with these symptoms. You may struggle with changing your smoking habits and rituals. The last step is the tricky one: Be prepared for the smoking urge to continue for a time. This is a lot to deal with, but keep at it. You will feel better.  Follow-up care is a key part of your treatment and safety. Be sure to make and go to all appointments, and call your doctor if you are having problems. It's also a good idea to know your test results and keep a list of the medicines you take.  How can you care for yourself at home?  ?? Ask your family, friends, and coworkers for support. You have a better chance of quitting if you have help and support.  ?? Join a support group, such as Nicotine Anonymous, for people who are trying to quit smoking.  ?? Consider signing up for a smoking cessation program, such as the American Lung Association's Freedom from Smoking program.  ?? Get text messaging support. Go to the website at www.smokefree.gov to sign up for the [facility] program.  ?? Set a quit date. Pick your date carefully so that it is not right in the middle of a big deadline or stressful time. Once you quit, do not even take a puff. Get rid of all ashtrays and lighters after your last cigarette. Clean your house and your clothes so that they do not smell of smoke.  ?? Learn how to be a nonsmoker. Think about ways you can avoid those things that make you reach for a cigarette.  ? Avoid situations that put you at  greatest risk for smoking. For some people, it is hard to have a drink with friends without smoking. For others, they might skip a coffee break with coworkers who smoke.  ? Change  your daily routine. Take a different route to work or eat a meal in a different place.  ?? Cut down on stress. Calm yourself or release tension by doing an activity you enjoy, such as reading a book, taking a hot bath, or gardening.  ?? Talk to your doctor or pharmacist about nicotine replacement therapy, which replaces the nicotine in your body. You still get nicotine but you do not use tobacco. Nicotine replacement products help you slowly reduce the amount of nicotine you need. These products come in several forms, many of them available over-the-counter:  ? Nicotine patches  ? Nicotine gum and lozenges  ? Nicotine inhaler  ?? Ask your doctor about bupropion (Wellbutrin) or varenicline (Chantix), which are prescription medicines. They do not contain nicotine. They help you by reducing withdrawal symptoms, such as stress and anxiety.  ?? Some people find hypnosis, acupuncture, and massage helpful for ending the smoking habit.  ?? Eat a healthy diet and get regular exercise. Having healthy habits will help your body move past its craving for nicotine.  ?? Be prepared to keep trying. Most people are not successful the first few times they try to quit. Do not get mad at yourself if you smoke again. Make a list of things you learned and think about when you want to try again, such as next week, next month, or next year.  Where can you learn more?  Go to [email or web address] and sign in to your MyChart account. Enter [phone] in the Search Health Information box to learn more about "Stopping Smoking: Care Instructions."     If you do not have an account, please click on the "Sign Up Now" link.  Current as of: December 11, 2017  Content Version: 12.3  ?? 2006-2019 [employer], [employer]. Care instructions adapted under license by [facility]. If you have questions about a medical condition or this instruction, always ask your healthcare professional. [employer] disclaims any warranty or liability for your use of this information.

## 2018-07-24 ENCOUNTER — Encounter: Payer: PRIVATE HEALTH INSURANCE | Primary: Family

## 2018-07-31 ENCOUNTER — Encounter: Payer: PRIVATE HEALTH INSURANCE | Primary: Family

## 2018-08-05 NOTE — Telephone Encounter (Signed)
PROTONIX?KW

## 2018-08-05 NOTE — Telephone Encounter (Signed)
PT IS CALLING ABOUT HIS MEDICINE FOR HIS STOMACH.  Pt takes medicine twice a day and it gives him diarrhea.    What can he do about this?  He has it every day.    Please call

## 2018-08-05 NOTE — Telephone Encounter (Signed)
Antibiotics for H pylori- can take with a probiotic - or try OTC imodium with the meds  As directed

## 2018-08-06 NOTE — Telephone Encounter (Signed)
CALLED AND SPOKE WITH BOTH PT WIFE AND PT AND ADVISED. HS

## 2018-08-07 ENCOUNTER — Encounter: Admit: 2018-08-07 | Discharge: 2018-08-07 | Payer: PRIVATE HEALTH INSURANCE | Primary: Family

## 2018-08-07 DIAGNOSIS — Z131 Encounter for screening for diabetes mellitus: Secondary | ICD-10-CM

## 2018-08-07 NOTE — Progress Notes (Signed)
Fasting labs drawn RA/mv  2 sst

## 2018-08-08 LAB — COMPREHENSIVE METABOLIC PANEL
ALT: 43 U/L — ABNORMAL HIGH (ref 10–40)
AST: 29 U/L (ref 15–37)
Albumin/Globulin Ratio: 1.5 (ref 1.1–2.2)
Albumin: 4.8 g/dL (ref 3.4–5.0)
Alkaline Phosphatase: 88 U/L (ref 40–129)
Anion Gap: 12 (ref 3–16)
BUN: 13 mg/dL (ref 7–20)
CO2: 26 mmol/L (ref 21–32)
Calcium: 10.3 mg/dL (ref 8.3–10.6)
Chloride: 102 mmol/L (ref 99–110)
Creatinine: 0.6 mg/dL — ABNORMAL LOW (ref 0.9–1.3)
GFR African American: >60 (ref >60)
GFR Non-African American: >60 (ref >60)
Globulin: 3.2 g/dL
Glucose: 89 mg/dL (ref 70–99)
Potassium: 4.3 mmol/L (ref 3.5–5.1)
Sodium: 140 mmol/L (ref 136–145)
Total Bilirubin: 0.5 mg/dL (ref 0.0–1.0)
Total Protein: 8 g/dL (ref 6.4–8.2)

## 2018-08-08 LAB — LIPID PANEL
Cholesterol, Total: 179 mg/dL (ref 0–199)
HDL: 40 mg/dL (ref 40–60)
LDL Calculated: 114 mg/dL — ABNORMAL HIGH (ref <100)
Triglycerides: 125 mg/dL (ref 0–150)
VLDL Cholesterol Calculated: 25 mg/dL

## 2018-08-08 LAB — HIV SCREEN
HIV ANTIGEN: NONREACTIVE
HIV Ag/Ab: NONREACTIVE
HIV-1 Antibody: NONREACTIVE
HIV-2 Ab: NONREACTIVE

## 2018-09-01 ENCOUNTER — Encounter: Payer: PRIVATE HEALTH INSURANCE | Attending: Family | Primary: Family

## 2018-09-15 ENCOUNTER — Encounter

## 2018-09-15 NOTE — Telephone Encounter (Signed)
CALLED AND SPOKE WITH PT AND ADVISED. HS

## 2018-09-15 NOTE — Telephone Encounter (Signed)
CALLED PT AND RESCHEDULED APPT ON Monday AND PT STATED THAT HE HAS FINSHED THE MEDICATION BUT HE IS STILL FEELING THE SAME WITH HIS STOMACH. HS

## 2018-09-15 NOTE — Telephone Encounter (Signed)
[facility] - Mark Manegold, MD  [address]  [place] [address]  Phone: [phone]  Fax: [phone]

## 2018-09-15 NOTE — Telephone Encounter (Signed)
PLEASE PUT IN REFERALL AND ROUTE BACK TO ME AND I WILL CALL PT. Patterson WEST. HS

## 2018-09-15 NOTE — Telephone Encounter (Signed)
I will refer him to GI- but he will need to wait as they are not seeing pts at this time due to COVID

## 2018-09-21 ENCOUNTER — Encounter: Payer: PRIVATE HEALTH INSURANCE | Attending: Family | Primary: Family

## 2018-11-09 ENCOUNTER — Encounter: Payer: PRIVATE HEALTH INSURANCE | Attending: Family | Primary: Family

## 2019-02-22 ENCOUNTER — Encounter: Payer: PRIVATE HEALTH INSURANCE | Attending: Family | Primary: Family

## 2019-03-12 ENCOUNTER — Encounter: Payer: PRIVATE HEALTH INSURANCE | Attending: Family | Primary: Family

## 2019-03-25 ENCOUNTER — Ambulatory Visit: Admit: 2019-03-25 | Discharge: 2019-03-25 | Payer: PRIVATE HEALTH INSURANCE | Attending: Family | Primary: Family

## 2019-03-25 DIAGNOSIS — L989 Disorder of the skin and subcutaneous tissue, unspecified: Secondary | ICD-10-CM

## 2019-03-25 NOTE — Progress Notes (Signed)
03/25/2019     Troy Church (DOB:  [birth year]) is a 28 y.o. male, here for evaluation of the following medical concerns:    HPI  Troy Church presents today with skin lesion to fold of buttock. States that he has had 2 spots for 3 years. Over the past few months, one of the spots have gotten bigger. He is concerned because he states one of his brothers had a similar spot on his face, and it was found to be cancerous. Denies any pain, redness, or drainage.    Troy Church is also inquiring about GI referral. States that he continues to have GI upset at times. A referral had been placed to GI, but he states that he lost their contact information and did not receive a call back from the GI office. He has gotten a new phone number and is concerned they will be unable to contact him.    Review of Systems   Constitutional: Negative for chills and fever.   Skin:        x2 skin lesions to the gluteal fold, one on the left and one on the right. Left is bigger than right.       Prior to Visit Medications    Medication Sig Taking? Authorizing Provider   pantoprazole (PROTONIX) 20 MG tablet Take 20 mg by mouth daily Yes Historical Provider, MD        Social History     Tobacco Use   ??? Smoking status: Current Every Day Smoker     Packs/day: 0.50     Types: Cigarettes     Start date: 04/17/2005   ??? Smokeless tobacco: Never Used   Substance Use Topics   ??? Alcohol use: Not Currently        Vitals:    03/25/19 1355   BP: 124/82   Site: Left Upper Arm   Position: Sitting   Cuff Size: Large Adult   Pulse: 90   SpO2: 99%   Weight: 157 lb 9.6 oz (71.5 kg)  Comment: SHOES ON   Height: 5\' 8"  (1.727 m)     Estimated body mass index is 23.96 kg/m?? as calculated from the following:    Height as of this encounter: 5\' 8"  (1.727 m).    Weight as of this encounter: 157 lb 9.6 oz (71.5 kg).    Physical Exam  Constitutional:       Appearance: Normal appearance. He is normal weight.   Skin:     General: Skin is warm and dry.      Comments: x2 lesions to the upper  gluteal fold. The left is larger than the right, measuring approximately 1 cm x 1 cm x 1 cm. The right is barely raised and measures approximately 0.25 cm x 0.25 cm. Surrounding skin clean, dry, and intact without drainage or erythema. Site is not tender.   Neurological:      Mental Status: He is alert.         ASSESSMENT/PLAN:  1. Skin lesion  -11401 - Excision Skin Benign 0.6-1 cm  - Patient was educated on the procedure. Site was prepped with betadine. Lidocaine gel was applied to the site. Using sterile technique, forceps and scissors were used to excise the lesion to the left gluteal fold. Small amount of bleeding noted. Silver nitrate was applied to the site. Bleeding no longer present. Lesion removed to be sent for surgical pathology. Patient experienced some pain, but tolerated well.   - Surgical Pathology; Future  -  Surgical Pathology    2. Epigastric pain  -  Had requested referral in past, but lost number to GI and personal phone number had changed  - [facility] - Nelida Meuse, MD, Gastroenterology, [facility]      Return if symptoms worsen or fail to improve.    An electronic signature was used to authenticate this note.    --Mickel Crow, APRN - CNP on 03/25/2019 at 3:47 PM

## 2019-04-06 ENCOUNTER — Emergency Department: Admit: 2019-04-06 | Payer: PRIVATE HEALTH INSURANCE | Primary: Family

## 2019-04-06 ENCOUNTER — Inpatient Hospital Stay
Admit: 2019-04-06 | Discharge: 2019-04-06 | Disposition: A | Discharge status: Home / Self Care | Payer: PRIVATE HEALTH INSURANCE

## 2019-04-06 DIAGNOSIS — S0083XA Contusion of other part of head, initial encounter: Secondary | ICD-10-CM

## 2019-04-06 MED ORDER — IBUPROFEN 600 MG PO TABS
600 MG | ORAL_TABLET | Freq: Three times a day (TID) | ORAL | 0 refills | Status: AC | PRN
Start: 2019-04-06 — End: 2019-04-16

## 2019-04-06 NOTE — ED Provider Notes (Signed)
**ADVANCED PRACTICE PROVIDER, I HAVE EVALUATED THIS PATIENT**        [place] ENCOUNTER      Pt Name: Troy Church  HQI:[id number]  [place] 1990/10/20  Date of evaluation: 04/06/2019  Provider: Vickki Hearing, PA-C      Chief Complaint:    Chief Complaint   Patient presents with   ??? Fall     pt fell when drinking multiple times, doesn't normally drink, right eye blank and blue, also chipped tooth that hurts       Nursing Notes, Past Medical Hx, Past Surgical Hx, Social Hx, Allergies, and Family Hx were all reviewed and agreed with or any disagreements were addressed in the HPI.    HPI:  (Location, Duration, Timing, Severity, Quality, Assoc Sx, Context, Modifying factors)  This is a  28 y.o. male who presents here to the emergency department, he states that he was at a festival yesterday drinking heavily, and fell into the kitchen table, hit his face.  He also admits to a tooth that is loose, chipped on his right upper central incisor.  He denies any loss of consciousness but is unsure.  Rates his pain level as 9/10 to his face and eye.  Pain is worse when he touches it better with rest.    PastMedical/Surgical History:      Diagnosis Date   ??? GERD (gastroesophageal reflux disease)      History reviewed. No pertinent surgical history.    Medications:  Discharge Medication List as of 04/06/2019  4:55 PM            Review of Systems:  Review of Systems  Positives and Pertinent negatives as per HPI.  Except as noted above in the ROS, problem specific ROS was completed and is negative.    Physical Exam:  Physical Exam  Vitals signs and nursing note reviewed.   Constitutional:       Appearance: He is well-developed. He is not diaphoretic.   HENT:      Head: Normocephalic and atraumatic.        Right Ear: External ear normal.      Left Ear: External ear normal.      Nose: Nose normal.      Mouth/Throat:      Dentition: Dental tenderness present.     Eyes:      General:          Right eye: No discharge.         Left eye: No discharge.   Neck:      Musculoskeletal: Normal range of motion and neck supple.   Pulmonary:      Effort: Pulmonary effort is normal. No respiratory distress.      Breath sounds: No stridor.   Musculoskeletal: Normal range of motion.   Skin:     General: Skin is warm and dry.      Coloration: Skin is not pale.   Neurological:      Mental Status: He is alert and oriented to person, place, and time.   Psychiatric:         Behavior: Behavior normal.         MEDICAL DECISION MAKING    Vitals:    Vitals:    04/06/19 1510   BP: 117/78   Pulse: 77   Resp: 18   Temp: 98.3 ??F (36.8 ??C)   TempSrc: Oral   SpO2: 97%   Weight: 153 lb (69.4 kg)  Height: 5\' 5"  (1.651 m)       LABS:Labs Reviewed - No data to display     Remainder of labs reviewed and werenegative at this time or not returned at the time of this note.    RADIOLOGY:   Non-plain film images such as CT, Ultrasound and MRI are read by the radiologist. I, Berlin HunAlexander Avari Nevares, PA-C have directly visualized the radiologic plain film image(s) with the below findings:        Interpretation per the Radiologist below, if available at the time of this note:    CT FACIAL BONES WO CONTRAST   Final Result   No acute traumatic injury of the facial bones.         CT HEAD WO CONTRAST   Final Result   No acute intracranial abnormality.              Ct Head Wo Contrast    Result Date: 04/06/2019  EXAMINATION: CT OF THE HEAD WITHOUT CONTRAST  04/06/2019 4:10 pm TECHNIQUE: CT of the head was performed without the administration of intravenous contrast. Dose modulation, iterative reconstruction, and/or weight based adjustment of the mA/kV was utilized to reduce the radiation dose to as low as reasonably achievable. COMPARISON: None. HISTORY: ORDERING SYSTEM PROVIDED HISTORY: facial injury TECHNOLOGIST PROVIDED HISTORY: If patient is on cardiac monitor and/or pulse ox, they may be taken off cardiac monitor and pulse ox, left on O2 if  currently on. All monitors reattached when patient returns to room. Has a "code stroke" or "stroke alert" been called?->No Reason for exam:->facial injury Reason for Exam: facial injury.  Fall (pt fell when drinking multiple times, doesn't normally drink, right eye blank and blue, also chipped tooth that hurts). Acuity: Acute Type of Exam: Initial FINDINGS: BRAIN/VENTRICLES: There is no acute intracranial hemorrhage, mass effect or midline shift.  No abnormal extra-axial fluid collection.  The gray-white differentiation is maintained without evidence of an acute infarct.  There is no evidence of hydrocephalus. ORBITS: The visualized portion of the orbits demonstrate no acute abnormality. SINUSES: The visualized paranasal sinuses and mastoid air cells demonstrate no acute abnormality. SOFT TISSUES/SKULL:  No acute abnormality of the visualized skull or soft tissues.     No acute intracranial abnormality.     Ct Facial Bones Wo Contrast    Result Date: 04/06/2019  EXAMINATION: CT OF THE FACE WITHOUT CONTRAST  04/06/2019 4:10 pm TECHNIQUE: CT of the face was performed without the administration of intravenous contrast. Multiplanar reformatted images are provided for review. Dose modulation, iterative reconstruction, and/or weight based adjustment of the mA/kV was utilized to reduce the radiation dose to as low as reasonably achievable. COMPARISON: None HISTORY: ORDERING SYSTEM PROVIDED HISTORY: injury to left eye TECHNOLOGIST PROVIDED HISTORY: Reason for exam:->injury to left eye Reason for Exam: facial injury.  Fall (pt fell when drinking multiple times, doesn't normally drink, right eye blank and blue, also chipped tooth that hurts). Acuity: Acute Type of Exam: Initial FINDINGS: FACIAL BONES:  The maxilla, pterygoid plates and zygomatic arches are intact. The mandible is intact.  The mandibular condyles are normally situated.  The nasal bones and maxillary nasal processes are intact. ORBITS:  The globes appear  intact.  The extraocular muscles, optic nerve sheath complexes and lacrimal glands appear unremarkable.  No retrobulbar hematoma or mass is seen.  The orbital walls and rims are intact. SINUSES/MASTOIDS:  Mild mucosal thickening in the maxillary sinuses and ethmoid air cells.  The mastoid air cells are clear. SOFT  TISSUES:  Soft tissue contusion in the right infraorbital region and overlying the maxilla.  No other focal soft tissue abnormality.     No acute traumatic injury of the facial bones.          MEDICAL DECISION MAKING / ED COURSE:      PROCEDURES:   Procedures    None    Patient was given:  Medications - No data to display    No evidence of intracranial bleeding or facial fracture at this time.    The patient tolerated their visit well.  I evaluated the patient.  The physician was available for consultation as needed.  The patient and / or the family were informed of the results of any tests, a time was given to answer questions, a plan was proposed and they agreed with plan.      CLINICAL IMPRESSION:  1. Facial contusion, initial encounter    2. Subconjunctival hemorrhage of right eye    3. Loose tooth due to trauma        DISPOSITION Decision To Discharge 04/06/2019 04:51:32 PM      PATIENT REFERRED TO:  Constance Holster, APRN - CNP  [address]  [place] [address]  [phone]    Call in 1 day  For a recheck in 1-3 days    see the dental clinic list    Call in 1 day  For a recheck in 1-3 days      DISCHARGE MEDICATIONS:  Discharge Medication List as of 04/06/2019  4:55 PM      START taking these medications    Details   ibuprofen (IBU) 600 MG tablet Take 1 tablet by mouth every 8 hours as needed for Pain, Disp-20 tablet,R-0Print             DISCONTINUED MEDICATIONS:  Discharge Medication List as of 04/06/2019  4:55 PM      STOP taking these medications       pantoprazole (PROTONIX) 20 MG tablet Comments:   Reason for Stopping:                      (Please note the MDM and HPI sections of  this note were completed with a voice recognition program.  Efforts were made to edit the dictations but occasionally words are mis-transcribed.)    Electronically signed, Berlin Hun, PA-C,           Berlin Hun, PA-C  04/09/19 1031

## 2019-04-06 NOTE — Discharge Instructions (Signed)
Dental Emergency Referrals    [place] [facility] ([place] residents only)    [facility]  [address] (25)  [phone]   [facility]  [address]  [phone]   [facility]  [address]  [phone]   [facility]  (entrance on [address]  [address]  [phone]   [facility]  [address]  [phone]   [facility]  [address]  [phone]       [place] [facility] offering Dental Services     [facility]  [address]  [phone]   [facility]  [address]  [phone]     [facility]  [address] 2nd floor  [phone]   [facility]  [address] (10)   [phone]     [facility]  [place]: [address]  [place]: [address]  [phone]   [facility]  [address]  [phone]     [facility]  [facility]  [address]  [phone]   [facility]  [address]  [phone]     [facility]  [address]  Oral Surgery Dept: [phone]  Dental Clinic: [phone]   [facility]  [address]  [phone]     [facility]  [address] (15)  [phone]   [facility]   [address], [place] [address]  [place] : [phone]  [place] : [phone]     [facility]  [address]  [phone]    Other Dental Clinics in the area    [facility] ([facility])  [place]  (Across from [employer])  [address] [place] [address]  [phone]?      Pediatric Only Dentists    [facility]   Up to age 21  [address]  [phone]    [facility]  Up to age 21  [place]:  [place] on [address]   [phone] or [phone]  [place]: [address]  [phone]    [facility]  Up to age 16  [address] (29)   [phone]

## 2021-07-10 DIAGNOSIS — R1084 Generalized abdominal pain: Secondary | ICD-10-CM

## 2021-07-10 NOTE — ED Provider Notes (Addendum)
I independently saw performed a substantive portion of the visit (history, physical, and MDM) on Troy Church.   All diagnostic, treatment, and disposition decisions were made by myself in conjunction with the advanced practice provider.  I have participated in the medical decision making and directed the treatment plan and disposition of the patient.    For further details of [facility] emergency department encounter, please see the advanced practice provider's documentation.    I, Dalbert Batman, MD, am the primary physician provider of record.    CHIEF COMPLAINT  Chief Complaint   Patient presents with    Abdominal Pain     Pt arrived in the ED via walk in   Pt stated he has been having ab pain for one week   Pt stated his whole abd hurts after he eats that is a burning sensation and feels hot  Pt stated this also happens when he breathes sometimes as well      Briefly, Troy Church is a 31 y.o. male  who presents to the ED complaining of about a week of generalized abdominal discomfort that is specifically postprandial and burning in quality.  Triage note comments that it is worse when he breathes however for me he says he is not short of breath and denies pleurisy or chest pain.  No fevers.  Some nausea.  No vomiting though.  He does drink and smoke and says that he eats a lot of spicy food.  No fevers.    FOCUSED PHYSICAL EXAMINATION  BP 130/86    Pulse 76    Temp 97.5 ??F (36.4 ??C) (Oral)    Resp 18    SpO2 97%    Focused physical examination notable for no acute distress, well-appearing, well-nourished, normal speech and mentation without obvious facial droop, no obvious rash.  No obvious cranial nerve deficits on my initial exam.  Minimal diffuse abdominal tenderness with normal bowel sounds no peritonitis and no distention.  Regular rate and rhythm clear to auscultation bilaterally      EKG performed in ED:  None        ED COURSE/MDM  Patient seen and evaluated. Old records reviewed. Labs and imaging reviewed.       After initial evaluation, differential diagnostic considerations included but not limited to: kidney stone, pyelonephritis, UTI, appendicitis, bowel obstruction, diverticulitis, hernia, gastritis/gastroenteritis, pancreatitis, cholecystitis, hepatitis, constipation, IBS, IBD    The patient's ED workup was notable for generalized postprandial burning abdominal discomfort.  This is consistent with most likely dyspepsia versus gastroenteritis or less likely food poisoning based on timeline of symptoms.  However, CT abdomen and pelvis was obtained and definitively ruled out surgical or acutely infectious process.  Additionally, he does not have a leukocytosis metabolic derangement lipase elevation or LFT elevation to suggest hepatobiliary etiology or other acutely concerning findings in his abdominal pain work-up.  Urinalysis is normal to rule out UTI and he has no hematuria.  He was given a GI cocktail with some symptom improvement.  Recommended NSAID and alcohol avoidance, smoking cessation, and dietary counseling.  He was started on a PPI and prescribed medication for symptom control for home and told to follow-up closely with his PCP.      Is this patient to be included in the SEP-1 Core Measure?  No   Exclusion criteria - the patient is NOT to be included for SEP-1 Core Measure due to:  Infection is not suspected      Consults:  None  History obtained from: Patient    Pertinent social determinants of health: None applicable    Chronic conditions potentially affecting care: None applicable    Review of other records:  None    Reassessment:  See MDM for details of medications given and reassessment    During the patient's ED course, the patient was given:  Medications   aluminum & magnesium hydroxide-simethicone (MAALOX) 30 mL, lidocaine viscous hcl (XYLOCAINE) 5 mL (GI COCKTAIL) ( Oral Given 07/10/21 2357)        CLINICAL IMPRESSION  1. Generalized postprandial abdominal pain        Blood pressure 130/86, pulse  76, temperature 97.5 ??F (36.4 ??C), temperature source Oral, resp. rate 18, SpO2 97 %.    DISPOSITION  Troy Church was discharged in stable condition.    I have discussed the findings of today's workup with the patient and addressed the patient's questions and concerns.  Important warning signs as well as new or worsening symptoms which would necessitate immediate return to the ED were discussed.  The plan is to discharge from the ED at this time, and the patient is in stable condition.  The patient acknowledged understanding is agreeable with this plan.    Patient was given scripts for the following medications. I counseled patient how to take these medications.   Discharge Medication List as of 07/11/2021  1:57 AM        START taking these medications    Details   dicyclomine (BENTYL) 10 MG capsule Take 1 capsule by mouth 3 times daily as needed (abdominal cramping), Disp-20 capsule, R-1Normal      omeprazole (PRILOSEC) 20 MG delayed release capsule Take 1 capsule by mouth every morning (before breakfast), Disp-30 capsule, R-0Normal             Follow-up with:  Constance Holster, APRN - CNP  [address]  [place] [address]  [phone]    Schedule an appointment as soon as possible for a visit in 1 week  For symptom re-evaluation    [facility]  [address]  [place] [address]  [phone]  Go to   If symptoms worsen    Critical care time:  None    DISCLAIMER: This chart was created using Dragon dictation software.  Efforts were made by me to ensure accuracy, however some errors may be present due to limitations of this technology and occasionally words are not transcribed correctly.        Dalbert Batman, MD  07/11/21 8676       Dalbert Batman, MD  07/11/21 [phone]

## 2021-07-10 NOTE — ED Provider Notes (Signed)
[facility]  EMERGENCY DEPARTMENT ENCOUNTER        Pt Name: Troy Church  MRN: [id number]  Birthdate 02/06/1991  Date of evaluation: 07/10/2021  Provider: Fabiola BackerKristen S Jaydee Ingman, APRN - CNP  PCP: Constance Holsterharlene Lisa Cureton, APRN - CNP  Note Started: 1:28 AM EST 07/11/21       I have seen and evaluated this patient with my supervising physician Dalbert BatmanKenneth Aaron Berg, MD.      CHIEF COMPLAINT       Chief Complaint   Patient presents with    Abdominal Pain     Pt arrived in the ED via walk in   Pt stated he has been having ab pain for one week   Pt stated his whole abd hurts after he eats that is a burning sensation and feels hot  Pt stated this also happens when he breathes sometimes as well        HISTORY OF PRESENT ILLNESS: 1 or more Elements     History from : Patient    Limitations to history : None    Troy Church is a 31 y.o. male who presents to the emergency department with complaints of generalized abdominal pain, worse after eating, described at times burning and other times sharp.  No mitigating factors.  No medications prior to arrival.  No history of the same.  No history of abdominal surgeries    Denies any headache, fever, lightheadedness, dizziness, visual disturbances.  No chest pain or pressure.  No neck or back pain.  No shortness of breath, cough, or congestion.  No vomiting, diarrhea, constipation, or dysuria.  No rash.    Nursing Notes were all reviewed and agreed with or any disagreements were addressed in the HPI.    REVIEW OF SYSTEMS :      Review of Systems   Constitutional:  Negative for activity change, chills and fever.   Respiratory:  Negative for chest tightness and shortness of breath.    Cardiovascular:  Negative for chest pain.   Gastrointestinal:  Positive for abdominal pain. Negative for diarrhea, nausea and vomiting.   Genitourinary:  Negative for dysuria.   All other systems reviewed and are negative.    Positives and Pertinent negatives as per HPI.     SURGICAL  HISTORY   History reviewed. No pertinent surgical history.    CURRENTMEDICATIONS       Discharge Medication List as of 07/11/2021  1:57 AM        CONTINUE these medications which have NOT CHANGED    Details   ibuprofen (IBU) 600 MG tablet Take 1 tablet by mouth every 8 hours as needed for Pain, Disp-20 tablet,R-0Print             ALLERGIES     Patient has no known allergies.    FAMILYHISTORY       Family History   Problem Relation Age of Onset    No Known Problems Mother     No Known Problems Father     No Known Problems Sister     No Known Problems Brother     No Known Problems Brother         SOCIAL HISTORY       Social History     Tobacco Use    Smoking status: Every Day     Packs/day: 0.50     Types: Cigarettes     Start date: 04/17/2005    Smokeless tobacco: Never  Vaping Use    Vaping Use: Every day   Substance Use Topics    Alcohol use: Not Currently    Drug use: Yes     Types: Marijuana Sheran Fava(Weed)     Comment: occasionally       SCREENINGS        Glasgow Coma Scale  Eye Opening: Spontaneous  Best Verbal Response: Oriented  Best Motor Response: Obeys commands  Glasgow Coma Scale Score: 15                CIWA Assessment  BP: 130/86  Heart Rate: 76           PHYSICAL EXAM  1 or more Elements     ED Triage Vitals [07/10/21 2321]   BP Temp Temp Source Heart Rate Resp SpO2 Height Weight   130/86 97.5 ??F (36.4 ??C) Oral 76 18 97 % -- --       Physical Exam  Vitals and nursing note reviewed.   Constitutional:       Appearance: He is well-developed. He is not diaphoretic.   HENT:      Head: Normocephalic and atraumatic.      Right Ear: External ear normal.      Left Ear: External ear normal.   Eyes:      General:         Right eye: No discharge.         Left eye: No discharge.   Neck:      Vascular: No JVD.   Cardiovascular:      Rate and Rhythm: Normal rate and regular rhythm.      Pulses: Normal pulses.      Heart sounds: Normal heart sounds.   Pulmonary:      Effort: Pulmonary effort is normal. No respiratory distress.       Breath sounds: Normal breath sounds.   Abdominal:      Palpations: Abdomen is soft.      Tenderness: There is abdominal tenderness.   Musculoskeletal:         General: Normal range of motion.   Skin:     General: Skin is warm and dry.      Coloration: Skin is not pale.   Neurological:      Mental Status: He is alert.   Psychiatric:         Behavior: Behavior normal.           DIAGNOSTIC RESULTS   LABS:    Labs Reviewed   CBC WITH AUTO DIFFERENTIAL - Abnormal; Notable for the following components:       Result Value    RBC 5.91 (*)     All other components within normal limits   COMPREHENSIVE METABOLIC PANEL - Abnormal; Notable for the following components:    Glucose 104 (*)     All other components within normal limits   LIPASE   URINALYSIS WITH REFLEX TO CULTURE       When ordered only abnormal lab results are displayed. All other labs were within normal range or not returned as of this dictation.    EKG: When ordered, EKG's are interpreted by the Emergency Department Physician in the absence of a cardiologist.  Please see their note for interpretation of EKG.    RADIOLOGY:   Non-plain film images such as CT, Ultrasound and MRI are read by the radiologist. Plain radiographic images are visualized and preliminarily interpreted by the ED Provider with the below findings:  Interpretation per the Radiologist below, if available at the time of this note:    CT ABDOMEN PELVIS WO CONTRAST Additional Contrast? None   Final Result   No acute finding in the abdomen and pelvis on this unenhanced study.           CT ABDOMEN PELVIS WO CONTRAST Additional Contrast? None    Result Date: 07/11/2021  EXAMINATION: CT OF THE ABDOMEN AND PELVIS WITHOUT CONTRAST 07/11/2021 12:07 am TECHNIQUE: CT of the abdomen and pelvis was performed without the administration of intravenous contrast. Multiplanar reformatted images are provided for review. Automated exposure control, iterative reconstruction, and/or weight based adjustment of  the mA/kV was utilized to reduce the radiation dose to as low as reasonably achievable. COMPARISON: None. HISTORY: ORDERING SYSTEM PROVIDED HISTORY: abd pain TECHNOLOGIST PROVIDED HISTORY: Reason for exam:->abd pain Additional Contrast?->None Decision Support Exception - unselect if not a suspected or confirmed emergency medical condition->Emergency Medical Condition (MA) Reason for Exam: Abd pain, Abdominal Pain (Pt arrived in the ED via walk in. Pt stated he has been having ab pain for one week. Pt stated his whole abd hurts after he eats that is a burning sensation and feels hot. Pt stated this also happens when he breathes sometimes as well ). FINDINGS: Lower Chest: Lung bases clear. Organs: Unremarkable liver, spleen, pancreas, adrenals, and gallbladder on this unenhanced study.  No radiopaque urolithiasis or hydroureteronephrosis on either side. GI/Bowel: Normal appendix.  Bowel loops nonobstructed. Pelvis: Urinary bladder incompletely distended.  Prostate normal in size. Peritoneum/Retroperitoneum: No free air or free fluid.  No adenopathy.  No abdominal aortic aneurysm. Bones/Soft Tissues: No acute osseous abnormality in the regional skeleton.     No acute finding in the abdomen and pelvis on this unenhanced study.       No results found.    PROCEDURES   Unless otherwise noted below, none     Procedures    CRITICAL CARE TIME (.cctime)   none    PAST MEDICAL HISTORY      has a past medical history of GERD (gastroesophageal reflux disease).     Chronic Conditions affecting Care: GERD    EMERGENCY DEPARTMENT COURSE and DIFFERENTIAL DIAGNOSIS/MDM:   Vitals:    Vitals:    07/10/21 2321   BP: 130/86   Pulse: 76   Resp: 18   Temp: 97.5 ??F (36.4 ??C)   TempSrc: Oral   SpO2: 97%       Patient was given the following medications:  Medications   aluminum & magnesium hydroxide-simethicone (MAALOX) 30 mL, lidocaine viscous hcl (XYLOCAINE) 5 mL (GI COCKTAIL) ( Oral Given 07/10/21 2357)             Is this patient to be  included in the SEP-1 Core Measure due to severe sepsis or septic shock?   No   Exclusion criteria - the patient is NOT to be included for SEP-1 Core Measure due to:  Infection is not suspected    CONSULTS: (Who and What was discussed)  None  Discussion with Other Profesionals : None    Social Determinants : None    Records Reviewed : None    CC/HPI Summary, DDx, ED Course, and Reassessment:     Briefly, this is a 31 year old male who presents to the emergency department with complaints of generalized abdominal pain, worse after eating, described at times burning and other times sharp.  No mitigating factors.  No medications prior to arrival.  No history of the  same.  No history of abdominal surgeries    CT ABDOMEN PELVIS WO CONTRAST Additional Contrast? None (Final result)  Result time 07/11/21 01:07:14  Final result by Romana Juniper, MD (07/11/21 01:07:14)                Impression:    No acute finding in the abdomen and pelvis on this unenhanced study.               Labs are unremarkable.     He was given a gi cocktail.    Follow up with primary care.    I see nothing that would suggest an acute abdomen at this time. Based on history, physical exam, risk factors, and tests my suspicion for bowel obstruction, incarcerated hernia, acute pancreatitis, intra-abdominal abscess, perforated viscus, diverticulitis, cholecystitis, appendicitis, testicular torsion and cardiac ischemia is very low. There is no evidence of peritonitis, sepsis or toxicity at this time. I feel the patient can be managed as an outpatient with follow-up with his family doctor in 24-48 hours. Instructions have been given for the patient to return to the emergency department for worsening of the pain, high fevers, intractable vomiting, bleeding or any other concerns    Disposition Considerations (include 1 Tests not done, Shared Decision Making, Pt Expectation of Test or Tx.): I did consider an ultrasound of the abdomen/pelvis however the service  is unavailable at this hour and can to be ordered outpatient if necessary.    Shared decision making used throughout between myself, patient, and attending physician.  All are comfortable and agreeable with plan for discharge.    Appropriate for outpatient management discharge home      I am the Primary Clinician of Record.    FINAL IMPRESSION      1. Generalized postprandial abdominal pain          DISPOSITION/PLAN     DISPOSITION Decision To Discharge 07/11/2021 01:45:41 AM      PATIENT REFERRED TO:  Constance Holster, APRN - CNP  [address]  [place] [address]  [phone]    Schedule an appointment as soon as possible for a visit in 1 week  For symptom re-evaluation    [facility]  [address]  [place] [address]  [phone]  Go to   If symptoms worsen    DISCHARGE MEDICATIONS:  Discharge Medication List as of 07/11/2021  1:57 AM        START taking these medications    Details   dicyclomine (BENTYL) 10 MG capsule Take 1 capsule by mouth 3 times daily as needed (abdominal cramping), Disp-20 capsule, R-1Normal      omeprazole (PRILOSEC) 20 MG delayed release capsule Take 1 capsule by mouth every morning (before breakfast), Disp-30 capsule, R-0Normal             DISCONTINUED MEDICATIONS:  Discharge Medication List as of 07/11/2021  1:57 AM                 (Please note that portions of this note were completed with a voice recognition program.  Efforts were made to edit the dictations but occasionally words are mis-transcribed.)    Fabiola Backer, APRN - CNP (electronically signed)            Fabiola Backer, APRN - CNP  07/11/21 0215

## 2021-07-11 ENCOUNTER — Emergency Department: Admit: 2021-07-11 | Payer: PRIVATE HEALTH INSURANCE | Primary: Family

## 2021-07-11 ENCOUNTER — Inpatient Hospital Stay
Admit: 2021-07-11 | Discharge: 2021-07-11 | Disposition: A | Discharge status: Home / Self Care | Payer: PRIVATE HEALTH INSURANCE | Attending: Emergency Medicine

## 2021-07-11 LAB — CBC WITH AUTO DIFFERENTIAL
Basophils %: 0.5 %
Basophils Absolute: 0 10*3/uL (ref 0.0–0.2)
Eosinophils %: 0.8 %
Eosinophils Absolute: 0.1 10*3/uL (ref 0.0–0.6)
Hematocrit: 50.5 % (ref 40.5–52.5)
Hemoglobin: 17.1 g/dL (ref 13.5–17.5)
Lymphocytes %: 31.7 %
Lymphocytes Absolute: 2.4 10*3/uL (ref 1.0–5.1)
MCH: 29 pg (ref 26.0–34.0)
MCHC: 33.9 g/dL (ref 31.0–36.0)
MCV: 85.3 fL (ref 80.0–100.0)
MPV: 7.6 fL (ref 5.0–10.5)
Monocytes %: 7.4 %
Monocytes Absolute: 0.6 10*3/uL (ref 0.0–1.3)
Neutrophils %: 59.6 %
Neutrophils Absolute: 4.4 10*3/uL (ref 1.7–7.7)
Platelets: 313 10*3/uL (ref 135–450)
RBC: 5.91 M/uL — ABNORMAL HIGH (ref 4.20–5.90)
RDW: 13.1 % (ref 12.4–15.4)
WBC: 7.5 10*3/uL (ref 4.0–11.0)

## 2021-07-11 LAB — COMPREHENSIVE METABOLIC PANEL
ALT: 13 U/L (ref 10–40)
AST: 19 U/L (ref 15–37)
Albumin/Globulin Ratio: 1.3 (ref 1.1–2.2)
Albumin: 4.7 g/dL (ref 3.4–5.0)
Alkaline Phosphatase: 82 U/L (ref 40–129)
Anion Gap: 12 (ref 3–16)
BUN: 12 mg/dL (ref 7–20)
CO2: 25 mmol/L (ref 21–32)
Calcium: 9.8 mg/dL (ref 8.3–10.6)
Chloride: 101 mmol/L (ref 99–110)
Creatinine: 1 mg/dL (ref 0.9–1.3)
Est, Glom Filt Rate: >60 (ref >60)
Glucose: 104 mg/dL — ABNORMAL HIGH (ref 70–99)
Potassium: 3.7 mmol/L (ref 3.5–5.1)
Sodium: 138 mmol/L (ref 136–145)
Total Bilirubin: 1 mg/dL (ref 0.0–1.0)
Total Protein: 8.2 g/dL (ref 6.4–8.2)

## 2021-07-11 LAB — URINALYSIS WITH REFLEX TO CULTURE
Bilirubin Urine: NEGATIVE
Blood, Urine: NEGATIVE
Glucose, Ur: NEGATIVE mg/dL
Ketones, Urine: NEGATIVE mg/dL
Leukocyte Esterase, Urine: NEGATIVE
Nitrite, Urine: NEGATIVE
Protein, UA: NEGATIVE mg/dL
Specific Gravity, UA: <=1.005 (ref 1.005–1.030)
Urobilinogen, Urine: 0.2 E.U./dL (ref <2.0)
pH, UA: 6 (ref 5.0–8.0)

## 2021-07-11 LAB — LIPASE: Lipase: 36 U/L (ref 13.0–60.0)

## 2021-07-11 MED ORDER — OMEPRAZOLE 20 MG PO CPDR
20 MG | ORAL_CAPSULE | Freq: Every day | ORAL | 0 refills | Status: DC
Start: 2021-07-11 — End: 2021-09-07

## 2021-07-11 MED ORDER — DICYCLOMINE HCL 10 MG PO CAPS
10 MG | ORAL_CAPSULE | Freq: Three times a day (TID) | ORAL | 1 refills | Status: DC | PRN
Start: 2021-07-11 — End: 2022-01-09

## 2021-07-11 MED ORDER — ALUM & MAG HYDROXIDE-SIMETH 200-200-20 MG/5ML PO SUSP
200-200-20 | Freq: Once | ORAL | Status: AC
Start: 2021-07-11 — End: 2021-07-10
  Administered 2021-07-11: 05:00:00 via ORAL

## 2021-07-11 MED FILL — MAG-AL PLUS 200-200-20 MG/5ML PO LIQD: 200-200-20 MG/5ML | ORAL | Qty: 30

## 2021-08-01 ENCOUNTER — Inpatient Hospital Stay
Admit: 2021-08-01 | Discharge: 2021-08-01 | Disposition: A | Discharge status: Home / Self Care | Payer: PRIVATE HEALTH INSURANCE | Attending: Emergency Medicine

## 2021-08-01 DIAGNOSIS — J069 Acute upper respiratory infection, unspecified: Secondary | ICD-10-CM

## 2021-08-01 MED ORDER — OXYMETAZOLINE HCL 0.05 % NA SOLN
0.05 % | Freq: Two times a day (BID) | NASAL | 0 refills | Status: AC
Start: 2021-08-01 — End: 2021-09-03

## 2021-08-01 MED ORDER — PSEUDOEPHEDRINE HCL 30 MG PO TABS
30 MG | ORAL_TABLET | Freq: Four times a day (QID) | ORAL | 0 refills | Status: AC | PRN
Start: 2021-08-01 — End: 2021-08-05

## 2021-08-01 NOTE — ED Provider Notes (Signed)
[facility] EMERGENCY DEPARTMENT  eMERGENCY dEPARTMENT eNCOUnter        Pt Name: Troy Church  MRN: [id number]  Birthdate 12/31/1990  Date of evaluation: 08/01/2021  Provider: Ethelle Lyon, MD  PCP: Constance Holster, APRN - CNP      CHIEF COMPLAINT       Chief Complaint   Patient presents with    Nasal Congestion     Pt states he smoked his vape 2 weeks ago and has been congested since then. Dry mouth and pressure in nose.       HISTORY OFPRESENT ILLNESS   (Location/Symptom, Timing/Onset, Context/Setting, Quality, Duration, Modifying Factors,Severity)  Note limiting factors.     Troy Church is a 31 y.o. male patient complains of nasal irritation slight drainage clear for about 4 days states he vape 2 weeks ago and since then has had dryness to his nose and throat    Nursing Notes were all reviewed and agreed with or any disagreements were addressed  in the HPI.    REVIEW OF SYSTEMS    (2-9 systems for level 4, 10 or more for level 5)       REVIEW OF SYSTEMS    Constitutional:  Denies fever, chills, or weakness   Eyes:  Denies vision changes  HENT:  Denies sore throat or neck pain   Respiratory:  Denies cough or shortness of breath   Cardiovascular:  Denies chest pain  GI:  Denies abdominal pain, nausea, vomiting, or diarrhea   Musculoskeletal:  Denies back pain   Skin: no rash or vesicles   Neurologic:  no headache weakness focal    Lymphatic:  no swollen  nodes   Psychiatric: no si or hs thoughts     All systems negative except as marked.     Positives and Pertinent negatives as per HPI.  Except as noted above in the ROS, all other systems were reviewed andnegative.       PASTMEDICAL HISTORY     Past Medical History:   Diagnosis Date    GERD (gastroesophageal reflux disease)          SURGICAL HISTORY     History reviewed. No pertinent surgical history.      CURRENT MEDICATIONS       Previous Medications    DICYCLOMINE (BENTYL) 10 MG CAPSULE    Take 1 capsule by mouth 3 times daily as needed  (abdominal cramping)    IBUPROFEN (IBU) 600 MG TABLET    Take 1 tablet by mouth every 8 hours as needed for Pain    OMEPRAZOLE (PRILOSEC) 20 MG DELAYED RELEASE CAPSULE    Take 1 capsule by mouth every morning (before breakfast)       ALLERGIES     Patient has no known allergies.    FAMILY HISTORY       Family History   Problem Relation Age of Onset    No Known Problems Mother     No Known Problems Father     No Known Problems Sister     No Known Problems Brother     No Known Problems Brother           SOCIAL HISTORY       Social History     Socioeconomic History    Marital status: Single     Spouse name: None    Number of children: None    Years of education: None    Highest education level: None  Tobacco Use    Smoking status: Every Day     Packs/day: 0.50     Types: Cigarettes     Start date: 04/17/2005    Smokeless tobacco: Never   Vaping Use    Vaping Use: Every day   Substance and Sexual Activity    Alcohol use: Not Currently    Drug use: Yes     Types: Marijuana Sheran Fava)     Comment: occasionally    Sexual activity: Yes     Partners: Female       SCREENINGS    Glasgow Coma Scale  Eye Opening: Spontaneous  Best Verbal Response: Oriented  Best Motor Response: Obeys commands  Glasgow Coma Scale Score: 15        PHYSICAL EXAM    (up to 7 for level 4, 8 or more for level 5)     ED Triage Vitals [08/01/21 0250]   BP Temp Temp Source Heart Rate Resp SpO2 Height Weight   (!) 142/88 97.9 ??F (36.6 ??C) Oral 78 18 100 % -- 147 lb 3.2 oz (66.8 kg)           General Appearance:  Alert, cooperative, no distress, appears stated age.   Head:  Normocephalic, without obvious abnormality, atraumatic.   Eyes:  conjunctiva/corneas clear, EOM's intact.  Sclera anicteric.   ENT: Mucous membranes moist.   Neck: Supple, symmetrical, trachea midline, no adenopathy.  No jugular venous distention.     Lungs:   No Respiratory Distress.no rales  rhonchi rub   Chest Wall:  Nontender  no deformity   Heart:  Rsr no murmer gallop    Abdomen:    Soft nontender no organomegally    Extremities:  Full range of motion.no deformity   Pulses: Equal  upper and lower    Skin:  No rashes or lesions to exposed skin.   Neurologic: Alert and oriented X 3.   Motor grossly normal.  Speech clear.       DIAGNOSTIC RESULTS   LABS:    Labs Reviewed - No data to display    All other labs were within normal range or not returned as of thisdictation.    EKG: All EKG's are interpreted by the Emergency Department Physician who either signs or Co-signs this chart in the absence of a cardiologist.        RADIOLOGY:   Non-plain film images such as CT, Ultrasound and MRI are read by the radiologist. Plainradiographic images are visualized and preliminarily interpreted by the  ED Provider with the belowfindings:        Interpretation per the Radiologist below, if available at the time of this note:    No orders to display         PROCEDURES   Unless otherwise noted below, none     Procedures    CRITICAL CARE TIME   N/A      CONSULTS:  None    EMERGENCY DEPARTMENT COURSE and DIFFERENTIAL DIAGNOSIS/MDM:   Vitals:    Vitals:    08/01/21 0250   BP: (!) 142/88   Pulse: 78   Resp: 18   Temp: 97.9 ??F (36.6 ??C)   TempSrc: Oral   SpO2: 100%   Weight: 147 lb 3.2 oz (66.8 kg)       Patient was given the following medications:  Medications - No data to display        Is this patient to be included in the SEP-1 Core Measure due to  severe sepsis or septic shock?   No   Exclusion criteria - the patient is NOT to be included for SEP-1 Core Measure due to:  Viral etiology found or highly suspected (including COVID-19) without concomitant bacterial infection    Patient will use Afrin nasal spray and Sudafed follow-up with his primary care  The patient tolerated their visit well.   Thepatient and / or the family were informed of the results of any tests, a time was given to answer questions.    FINAL IMPRESSION      1. Upper respiratory tract infection, unspecified type        DISPOSITION/PLAN    DISPOSITION Decision To Discharge 08/01/2021 03:05:17 AM      PATIENT REFERRED TO:  Constance Holster, APRN - CNP  [address]  [place] [address]  [phone]            DISCHARGE MEDICATIONS:  New Prescriptions    OXYMETAZOLINE (12 HOUR NASAL SPRAY) 0.05 % NASAL SPRAY    2 sprays by Nasal route 2 times daily    PSEUDOEPHEDRINE (DECONGESTANT) 30 MG TABLET    Take 1 tablet by mouth every 6 hours as needed for Congestion       DISCONTINUED MEDICATIONS:  Discontinued Medications    No medications on file              (Please note that portions of this note were completed with a voice recognition program.  Efforts were made to edit the dictations but occasionally words aremis-transcribed.)    Ethelle Lyon, MD (electronically signed)          Ethelle Lyon, MD  08/01/21 [phone]

## 2021-08-07 ENCOUNTER — Encounter: Payer: PRIVATE HEALTH INSURANCE | Primary: Family

## 2021-08-07 ENCOUNTER — Ambulatory Visit: Admit: 2021-08-07 | Discharge: 2021-08-07 | Payer: PRIVATE HEALTH INSURANCE | Attending: Family | Primary: Family

## 2021-08-07 DIAGNOSIS — R1084 Generalized abdominal pain: Secondary | ICD-10-CM

## 2021-08-07 MED ORDER — TETANUS-DIPHTH-ACELL PERTUSSIS 5-2.5-18.5 LF-MCG/0.5 IM SUSP
Freq: Once | INTRAMUSCULAR | 0 refills | Status: AC
Start: 2021-08-07 — End: 2021-08-07

## 2021-08-07 MED ORDER — FAMOTIDINE 20 MG PO TABS
20 MG | ORAL_TABLET | Freq: Two times a day (BID) | ORAL | 0 refills | Status: AC | PRN
Start: 2021-08-07 — End: 2021-11-05

## 2021-08-07 NOTE — Progress Notes (Signed)
H-Pylori collected and sent to lab./mv

## 2021-08-07 NOTE — Progress Notes (Signed)
Troy Church (DOB:  21-Nov-1990) is a 31 y.o. male,Established patient, here for evaluation of the following chief complaint(s):    Abdominal Pain (Esophageal issue when eating causing trouble breathing.  )      SUBJECTIVE/OBJECTIVE:  HPI  NEPALI INTERPRETER MINU # [id number]  Troy Church C/O BURNING SENSATION  IN HIS STOMACH WHEN HE EATS /DRINKS ANYTHING NO BURNING IN HIS THROAT HIS STOMACH IS NOT TENDER TO TOUCH- WHEN HE EATS SPICY FOODS HE HAS DIARRHEA BUT NO BLOOD NOTED- BURPING DOES HELP   HE ALSO FEELS LIKE HE HAS TROUBLE BREATHING AFTER HE SMOKES OR VAPES NO CHEST TIGHTNESS OR SOB- AND WHEN HIS THROAT IS DRY THIS MAKES IT WORSE  HE HAS STOPPED VAPING AND SMOKING AND THIS DID HELP FOR A FEW DAYS ONLY  Review of Systems   Gastrointestinal:  Positive for abdominal pain and diarrhea. Negative for abdominal distention, anal bleeding, blood in stool, constipation, nausea, rectal pain and vomiting.        Burning in his abdomen     Physical Exam  Vitals reviewed.   Constitutional:       General: He is awake. He is not in acute distress.     Appearance: Normal appearance. He is well-developed, well-groomed and normal weight. He is not ill-appearing, toxic-appearing or diaphoretic.   Abdominal:      General: Bowel sounds are normal.      Tenderness: There is no abdominal tenderness.   Neurological:      Mental Status: He is alert and oriented to person, place, and time.   Psychiatric:         Attention and Perception: Attention and perception normal.         Mood and Affect: Mood and affect normal.         Speech: Speech normal.         Behavior: Behavior normal. Behavior is cooperative.         Thought Content: Thought content normal.         Cognition and Memory: Cognition and memory normal.         Judgment: Judgment normal.       Troy Church was seen today for abdominal pain.    Diagnoses and all orders for this visit:    Generalized abdominal pain  -     H. Pylori Breath Test, Adult; Future    Gastroesophageal reflux disease, unspecified  whether esophagitis present  -     famotidine (PEPCID) 20 MG tablet; Take 1 tablet by mouth 2 times daily as needed (REFLUX)    Need for Tdap  -     Tetanus-Diphth-Acell Pertussis (BOOSTRIX) 5-2.5-18.5 LF-MCG/0.5 injection; Inject 0.5 mLs into the muscle once for 1 dose         An electronic signature was used to authenticate this note.    --Constance Holster, APRN - CNP

## 2021-08-09 LAB — H. PYLORI BREATH TEST, ADULT: H Pylori Breath Test: NEGATIVE

## 2021-08-09 NOTE — Telephone Encounter (Signed)
-----   Message from Leonides Schanz sent at 08/09/2021  1:33 PM EST -----  Subject: Results Request    QUESTIONS  Results: pylori breath test; Ordered by: Rich Fuchs   Date Performed: 2021-08-07  ---------------------------------------------------------------------------  --------------  Cleotis Lema INFO    [phone]; Do not leave any message, patient will call back for answer  ---------------------------------------------------------------------------  --------------

## 2021-08-09 NOTE — Telephone Encounter (Signed)
TRIED TO CALL PATIENT. VM NOT SET UP.   KMW

## 2021-08-10 NOTE — Telephone Encounter (Signed)
Px informed of he H. Pylori results.  Am

## 2021-08-24 NOTE — Patient Instructions (Incomplete)
GENERAL OFFICE POLICIES      Telephone Calls: Messages will be answered within 1-2 business days, unless the provider is out of the office.  If it is urgent a covering provider will answer. (this does not include Medication refills).    MyChart:  We recommend all patients sign up for MyChart.  Through this portal you can see your lab results, request refills, schedule appointments, pay your bill and send messages to the office.   MyChart messages will be answered within 1-2 business days unless the provider is out of the office.  For urgent matters, please call the office.  Appointments:  All appointments must be scheduled.  We ask all patients to schedule their next follow up appointment before they leave the office to make sure you will be able to be seen before you run out of medications.  24 hours notice is required to cancel or reschedule an appointment to avoid being marked as a no show.  You may be dismissed from the practice after 3 no shows.    LATE for Appointment: If you are 15 or more minutes late for your appointment, you may be asked to reschedule.  MA/LAB APPTS: Must be scheduled, cannot accept walk in lab visits.  We only draw labs for patients established in our office.  We only do injections for medications ordered by our office.  Acute Sick Visits:  Nothing other than acute complaint will be addressed at this visit.  TRADITIONAL MEDICARE  DOES NOT COVER PHYSICALS  MEDICARE WELLNESS VISITS: These are NOT physicals but the free annual visit offered by Medicare to discuss wellness issues. Medication refills, checkups, etc. will not be addressed during this visit.  Medication Refills: Refills are handled electronically so please contact your pharmacy for medication refills even if current refills have been exhausted. If you are on a controlled medication you will be referred to a specialist (pain specialist, psychiatry, etc).   Forms: There is a $35 fee to fill out FMLA/Disability paperwork, payable  at time of pick up. Instead of the fee, you can choose to have the paperwork filled out during a separate office visit that is for filling out the paperwork only.  Medication Samples:  This office does not carry medication samples.  If you need assistance in getting your medications, then please let the medical assistant know so they can help you sign up for a drug assistance program that can help get medications at a reduced cost or even free (if you qualify).  Workman's Comp Claims: We do not handle workman's comp cases or claims. You will need to go to an urgent care to be seen or to whomever your employer uses.  General - Any abusive/rude behavior toward staff/providers may be cause for dismissal.    You may receive a survey regarding the care you received during your visit.  Your input is valuable to us.  We encourage you to complete and return your survey.  We hope you will choose us in the future for your healthcare needs.

## 2021-08-27 ENCOUNTER — Encounter: Payer: PRIVATE HEALTH INSURANCE | Primary: Family

## 2021-08-27 NOTE — Patient Instructions (Incomplete)
GENERAL OFFICE POLICIES      Telephone Calls: Messages will be answered within 1-2 business days, unless the provider is out of the office.  If it is urgent a covering provider will answer. (this does not include Medication refills).    MyChart:  We recommend all patients sign up for MyChart.  Through this portal you can see your lab results, request refills, schedule appointments, pay your bill and send messages to the office.   MyChart messages will be answered within 1-2 business days unless the provider is out of the office.  For urgent matters, please call the office.  Appointments:  All appointments must be scheduled.  We ask all patients to schedule their next follow up appointment before they leave the office to make sure you will be able to be seen before you run out of medications.  24 hours notice is required to cancel or reschedule an appointment to avoid being marked as a no show.  You may be dismissed from the practice after 3 no shows.    LATE for Appointment: If you are 15 or more minutes late for your appointment, you may be asked to reschedule.  MA/LAB APPTS: Must be scheduled, cannot accept walk in lab visits.  We only draw labs for patients established in our office.  We only do injections for medications ordered by our office.  Acute Sick Visits:  Nothing other than acute complaint will be addressed at this visit.  TRADITIONAL MEDICARE  DOES NOT COVER PHYSICALS  MEDICARE WELLNESS VISITS: These are NOT physicals but the free annual visit offered by Medicare to discuss wellness issues. Medication refills, checkups, etc. will not be addressed during this visit.  Medication Refills: Refills are handled electronically so please contact your pharmacy for medication refills even if current refills have been exhausted. If you are on a controlled medication you will be referred to a specialist (pain specialist, psychiatry, etc).   Forms: There is a $35 fee to fill out FMLA/Disability paperwork, payable  at time of pick up. Instead of the fee, you can choose to have the paperwork filled out during a separate office visit that is for filling out the paperwork only.  Medication Samples:  This office does not carry medication samples.  If you need assistance in getting your medications, then please let the medical assistant know so they can help you sign up for a drug assistance program that can help get medications at a reduced cost or even free (if you qualify).  Workman's Comp Claims: We do not handle workman's comp cases or claims. You will need to go to an urgent care to be seen or to whomever your employer uses.  General - Any abusive/rude behavior toward staff/providers may be cause for dismissal.    You may receive a survey regarding the care you received during your visit.  Your input is valuable to us.  We encourage you to complete and return your survey.  We hope you will choose us in the future for your healthcare needs.

## 2021-08-28 ENCOUNTER — Encounter: Payer: PRIVATE HEALTH INSURANCE | Primary: Family

## 2021-09-03 ENCOUNTER — Encounter: Payer: PRIVATE HEALTH INSURANCE | Primary: Family

## 2021-09-03 NOTE — Patient Instructions (Signed)
GENERAL OFFICE POLICIES      Telephone Calls: Messages will be answered within 1-2 business days, unless the provider is out of the office.  If it is urgent a covering provider will answer. (this does not include Medication refills).    MyChart:  We recommend all patients sign up for MyChart.  Through this portal you can see your lab results, request refills, schedule appointments, pay your bill and send messages to the office.   MyChart messages will be answered within 1-2 business days unless the provider is out of the office.  For urgent matters, please call the office.  Appointments:  All appointments must be scheduled.  We ask all patients to schedule their next follow up appointment before they leave the office to make sure you will be able to be seen before you run out of medications.  24 hours notice is required to cancel or reschedule an appointment to avoid being marked as a no show.  You may be dismissed from the practice after 3 no shows.    LATE for Appointment: If you are 15 or more minutes late for your appointment, you may be asked to reschedule.  MA/LAB APPTS: Must be scheduled, cannot accept walk in lab visits.  We only draw labs for patients established in our office.  We only do injections for medications ordered by our office.  Acute Sick Visits:  Nothing other than acute complaint will be addressed at this visit.  TRADITIONAL MEDICARE  DOES NOT COVER PHYSICALS  MEDICARE WELLNESS VISITS: These are NOT physicals but the free annual visit offered by Medicare to discuss wellness issues. Medication refills, checkups, etc. will not be addressed during this visit.  Medication Refills: Refills are handled electronically so please contact your pharmacy for medication refills even if current refills have been exhausted. If you are on a controlled medication you will be referred to a specialist (pain specialist, psychiatry, etc).   Forms: There is a $35 fee to fill out FMLA/Disability paperwork, payable  at time of pick up. Instead of the fee, you can choose to have the paperwork filled out during a separate office visit that is for filling out the paperwork only.  Medication Samples:  This office does not carry medication samples.  If you need assistance in getting your medications, then please let the medical assistant know so they can help you sign up for a drug assistance program that can help get medications at a reduced cost or even free (if you qualify).  Workman's Comp Claims: We do not handle workman's comp cases or claims. You will need to go to an urgent care to be seen or to whomever your employer uses.  General - Any abusive/rude behavior toward staff/providers may be cause for dismissal.    You may receive a survey regarding the care you received during your visit.  Your input is valuable to us.  We encourage you to complete and return your survey.  We hope you will choose us in the future for your healthcare needs.

## 2021-09-04 NOTE — Progress Notes (Signed)
Third no-show in 1 week

## 2021-09-07 ENCOUNTER — Encounter: Admit: 2021-09-07 | Discharge: 2021-09-07 | Payer: PRIVATE HEALTH INSURANCE | Attending: Family | Primary: Family

## 2021-09-07 DIAGNOSIS — R0602 Shortness of breath: Secondary | ICD-10-CM

## 2021-09-07 MED ORDER — FLUTICASONE PROPIONATE 50 MCG/ACT NA SUSP
50 MCG/ACT | Freq: Every day | NASAL | 1 refills | Status: DC
Start: 2021-09-07 — End: 2021-11-26

## 2021-09-07 MED ORDER — OMEPRAZOLE 20 MG PO CPDR
20 MG | ORAL_CAPSULE | Freq: Every day | ORAL | 0 refills | Status: AC
Start: 2021-09-07 — End: 2022-01-09

## 2021-09-07 NOTE — Progress Notes (Signed)
Troy Church (DOB:  [birth year]) is a 31 y.o. male,Established patient, here for evaluation of the following chief complaint(s):  Other (Pt c/o trouble breathing after drinking cold drinks x 2 months )      ASSESSMENT/PLAN:  1. Shortness of breath  -Anxiety versus dysphagia versus vocal cord paralysis versus esophageal stricture versus laryngeal reflux.  Referring to ENT to further evaluate.  Also changing Pepcid to omeprazole.  Follow-up as needed.  May need a speech eval if ENT work-up is unremarkable.  -     [facility] - Justice Deeds, DO, Otolaryngology, [place]  2. Seasonal allergic rhinitis, unspecified trigger  -The patient has a left nasal polyp.  Recommended using Flonase daily.  Sending to pharmacy.  Educated on medication use as well as side effects.  Can also be evaluated by ENT.  -     fluticasone (FLONASE) 50 MCG/ACT nasal spray; 2 sprays by Each Nostril route daily, Disp-48 g, R-1Normal  3. Nasal polyp  -The patient has a left nasal polyp.  Recommended using Flonase daily.  Sending to pharmacy.  Educated on medication use as well as side effects.  Can also be evaluated by ENT.  -     [facility] - Justice Deeds, DO, Otolaryngology, [place]  4. Gastroesophageal reflux disease, unspecified whether esophagitis present  -Uncontrolled with Pepcid.  Prescribing omeprazole.  Educated on medication use as well as side effects.  Follow-up as needed no improvement.  Could consider referral to GI at that time.  -     omeprazole (PRILOSEC) 20 MG delayed release capsule; Take 1 capsule by mouth every morning (before breakfast), Disp-30 capsule, R-0Normal    Return if symptoms worsen or fail to improve.    SUBJECTIVE/OBJECTIVE:  HPI  Troy Church presents today regarding episodes of shortness of breath.  He states that for months he has noticed that if he drinks cold beverages or eat certain foods he will develop shortness of breath after swallowing them.  He states that this has been a problem for quite some time.   States that his throat feels "soft."He does not feel like he has a strong swallow.  He has had some issues with gastric reflux as well and had been prescribed Pepcid.  States that this does not seem to help with this.  Concerned he may have an ulcer.  H. pylori was performed in February and was negative.  Did not know if a scope would benefit.  He also notes that he has been congested lately.  Worse on the left side.    Review of Systems   Constitutional:  Negative for chills and fever.   HENT:  Positive for congestion, sinus pressure and trouble swallowing. Negative for postnasal drip, rhinorrhea, sinus pain and sore throat.    Respiratory:  Positive for shortness of breath. Negative for cough and wheezing.    Cardiovascular:  Negative for chest pain, palpitations and leg swelling.   Neurological:  Negative for dizziness, weakness, light-headedness, numbness and headaches.   Psychiatric/Behavioral:  Negative for decreased concentration, dysphoric mood, self-injury, sleep disturbance and suicidal ideas. The patient is not nervous/anxious.      Physical Exam  Constitutional:       General: He is not in acute distress.     Appearance: Normal appearance. He is normal weight.   HENT:      Right Ear: Tympanic membrane, ear canal and external ear normal. There is no impacted cerumen.      Left Ear: Tympanic membrane, ear canal and external  ear normal. There is no impacted cerumen.      Nose: Congestion present. No rhinorrhea.      Comments: Bilateral nasal polyps, worse on the left     Mouth/Throat:      Mouth: Mucous membranes are moist.      Pharynx: Oropharynx is clear. No oropharyngeal exudate or posterior oropharyngeal erythema.   Cardiovascular:      Pulses: Normal pulses.      Heart sounds: Normal heart sounds. No murmur heard.    No gallop.   Pulmonary:      Effort: Pulmonary effort is normal.      Breath sounds: Normal breath sounds. No wheezing.   Neurological:      Mental Status: He is alert and oriented to  person, place, and time.   Psychiatric:         Mood and Affect: Mood normal.         Behavior: Behavior normal.         Thought Content: Thought content normal.         Judgment: Judgment normal.             This dictation was generated by voice recognition computer software.  Although all attempts are made to edit the dictation for accuracy, there may be errors in the transcription that are not intended.    An electronic signature was used to authenticate this note.    --Mickel Crow, APRN - CNP

## 2021-09-07 NOTE — Patient Instructions (Signed)
You may receive a survey regarding the care you received during your visit.  Your input is valuable to us.  We encourage you to complete and return your survey.  We hope you will choose us in the future for your healthcare needs.     GENERAL OFFICE POLICIES      Telephone Calls: Messages will be answered within 1-2 business days, unless the provider is out of the office.  If it is urgent a covering provider will answer. (this does not include Medication refills).    MyChart:  We recommend all patients sign up for MyChart.  Through this portal you can see your lab results, request refills, schedule appointments, pay your bill and send messages to the office.   MyChart messages will be answered within 1-2 business days unless the provider is out of the office.  For urgent matters, please call the office.  Appointments:  All appointments must be scheduled.  We ask all patients to schedule their next follow up appointment before they leave the office to make sure you will be able to be seen before you run out of medications.  24 hours notice is required to cancel or reschedule an appointment to avoid being marked as a no show.  You may be dismissed from the practice after 3 no shows.    LATE for Appointment: If you are 15 or more minutes late for your appointment, you may be asked to reschedule.  MA/LAB APPTS: Must be scheduled, cannot accept walk in lab visits.  We only draw labs for patients established in our office.  We only do injections for medications ordered by our office.  Acute Sick Visits:  Nothing other than acute complaint will be addressed at this visit.  TRADITIONAL MEDICARE  DOES NOT COVER PHYSICALS  MEDICARE WELLNESS VISITS: These are NOT physicals but the free annual visit offered by Medicare to discuss wellness issues. Medication refills, checkups, etc. will not be addressed during this visit.  Medication Refills: Refills are handled electronically so please contact your pharmacy for medication  refills even if current refills have been exhausted. If you are on a controlled medication you will be referred to a specialist (pain specialist, psychiatry, etc).   Forms: There is a $35 fee to fill out FMLA/Disability paperwork, payable at time of pick up. Instead of the fee, you can choose to have the paperwork filled out during a separate office visit that is for filling out the paperwork only.  Medication Samples:  This office does not carry medication samples.  If you need assistance in getting your medications, then please let the medical assistant know so they can help you sign up for a drug assistance program that can help get medications at a reduced cost or even free (if you qualify).  Workman's Comp Claims: We do not handle workman's comp cases or claims. You will need to go to an urgent care to be seen or to whomever your employer uses.  General - Any abusive/rude behavior toward staff/providers may be cause for dismissal.

## 2021-10-08 ENCOUNTER — Encounter
Payer: PRIVATE HEALTH INSURANCE | Attending: Student in an Organized Health Care Education/Training Program | Primary: Family

## 2021-10-12 NOTE — Patient Instructions (Signed)
You may receive a survey regarding the care you received during your visit.  Your input is valuable to us.  We encourage you to complete and return your survey.  We hope you will choose us in the future for your healthcare needs.  GENERAL OFFICE POLICIES      Telephone Calls: Messages will be answered within 1-2 business days, unless the provider is out of the office.  If it is urgent a covering provider will answer. (this does not include Medication refills).    MyChart:  We recommend all patients sign up for MyChart.  Through this portal you can see your lab results, request refills, schedule appointments, pay your bill and send messages to the office.   MyChart messages will be answered within 1-2 business days unless the provider is out of the office.  For urgent matters, please call the office.  Appointments:  All appointments must be scheduled.  We ask all patients to schedule their next follow up appointment before they leave the office to make sure you will be able to be seen before you run out of medications.  24 hours notice is required to cancel or reschedule an appointment to avoid being marked as a no show.  You may be dismissed from the practice after 3 no shows.    LATE for Appointment: If you are 15 or more minutes late for your appointment, you may be asked to reschedule.  MA/LAB APPTS: Must be scheduled, cannot accept walk in lab visits.  We only draw labs for patients established in our office.  We only do injections for medications ordered by our office.  Acute Sick Visits:  Nothing other than acute complaint will be addressed at this visit.  TRADITIONAL MEDICARE  DOES NOT COVER PHYSICALS  MEDICARE WELLNESS VISITS: These are NOT physicals but the free annual visit offered by Medicare to discuss wellness issues. Medication refills, checkups, etc. will not be addressed during this visit.  Medication Refills: Refills are handled electronically so please contact your pharmacy for medication refills  even if current refills have been exhausted. If you are on a controlled medication you will be referred to a specialist (pain specialist, psychiatry, etc).   Forms: There is a $35 fee to fill out FMLA/Disability paperwork, payable at time of pick up. Instead of the fee, you can choose to have the paperwork filled out during a separate office visit that is for filling out the paperwork only.  Medication Samples:  This office does not carry medication samples.  If you need assistance in getting your medications, then please let the medical assistant know so they can help you sign up for a drug assistance program that can help get medications at a reduced cost or even free (if you qualify).  Workman's Comp Claims: We do not handle workman's comp cases or claims. You will need to go to an urgent care to be seen or to whomever your employer uses.  General - Any abusive/rude behavior toward staff/providers may be cause for dismissal.

## 2021-10-14 NOTE — Progress Notes (Unsigned)
History and Physical      Troy Church  Date of Birth:  [birth year]    Date of Service:  10/15/2021    Chief Complaint:   Troy Church is a 31 y.o. male who presents for complete physical examination.    HPI: ***    Wt Readings from Last 3 Encounters:   09/07/21 150 lb (68 kg)   08/07/21 143 lb (64.9 kg)   08/01/21 147 lb 3.2 oz (66.8 kg)     BP Readings from Last 3 Encounters:   09/07/21 110/70   08/07/21 112/80   08/01/21 (!) 142/88       There is no problem list on file for this patient.      No Known Allergies  No outpatient medications have been marked as taking for the 10/15/21 encounter (Office Visit) with Constance Holster, APRN - CNP.       Past Medical History:   Diagnosis Date    GERD (gastroesophageal reflux disease)      No past surgical history on file.  Family History   Problem Relation Age of Onset    No Known Problems Mother     No Known Problems Father     No Known Problems Sister     No Known Problems Brother     No Known Problems Brother      Social History     Socioeconomic History    Marital status: Single     Spouse name: Not on file    Number of children: Not on file    Years of education: Not on file    Highest education level: Not on file   Occupational History    Not on file   Tobacco Use    Smoking status: Former     Packs/day: 0.50     Years: 2.00     Pack years: 1.00     Types: Cigarettes     Start date: 04/17/2005     Quit date: 08/07/2021     Years since quitting: 0.1    Smokeless tobacco: Never   Vaping Use    Vaping Use: Every day   Substance and Sexual Activity    Alcohol use: Not Currently    Drug use: Yes     Types: Marijuana Sheran Fava)     Comment: occasionally    Sexual activity: Yes     Partners: Female   Other Topics Concern    Not on file   Social History Narrative    Not on file     Social Determinants of Health     Financial Resource Strain: Low Risk     Difficulty of Paying Living Expenses: Not hard at all   Food Insecurity: No Food Insecurity    Worried About Programme researcher, broadcasting/film/video in the  Last Year: Never true    Ran Out of Food in the Last Year: Never true   Transportation Needs: Unknown    Lack of Transportation (Medical): Not on file    Lack of Transportation (Non-Medical): No   Physical Activity: Not on file   Stress: Not on file   Social Connections: Not on file   Intimate Partner Violence: Not on file   Housing Stability: Unknown    Unable to Pay for Housing in the Last Year: Not on file    Number of Places Lived in the Last Year: Not on file    Unstable Housing in the Last Year: No  Review of Systems:  {ROS Comprehensive:22074::"A comprehensive review of systems was negative except for what was noted in the HPI."}     Physical Exam:   There were no vitals filed for this visit.  There is no height or weight on file to calculate BMI.  Constitutional: He is oriented to person, place, and time. He appears well-developed and well-nourished. No distress.   HEENT:   Head: Normocephalic and atraumatic.   Right Ear: Tympanic membrane, external ear and ear canal normal.   Left Ear: Tympanic membrane, external ear and ear canal normal.   Mouth/Throat: Oropharynx is clear and moist and mucous membranes are normal. No oropharyngeal exudate or posterior oropharyngeal erythema. He has no cervical adenopathy.   Eyes: Conjunctivae and extraocular motions are normal. Pupils are equal, round, and reactive to light.   Neck:  Supple. No JVD present. Carotid bruit is not present. No mass and no thyromegaly present.   Cardiovascular: Normal rate, regular rhythm, normal heart sounds and intact distal pulses.  Exam reveals no gallop and no friction rub. No murmur heard.  Pulmonary/Chest: Effort normal and breath sounds normal. No respiratory distress. He has no wheezes, rhonchi or rales.   Abdominal: Soft, non-tender. Bowel sounds and aorta are normal. There is no organomegaly, mass or bruit.   Genitourinary:  {Rectal/prostate exam:5057}.  Musculoskeletal: Normal range of motion, no synovitis. He exhibits no  edema.   Neurological: He is alert and oriented to person, place, and time. He has normal reflexes. No cranial nerve deficit. Coordination normal.   Skin: Skin is warm, dry and intact.  No suspicious lesions are noted.  Psychiatric: He has a normal mood and affect. His speech is normal and behavior is normal. Judgment, cognition and memory are normal.     Preventive Care:  Health Maintenance   Topic Date Due    DTaP/Tdap/Td vaccine (1 - Tdap) Never done    Flu vaccine (Season Ended) 08/07/2022 (Originally 01/08/2022)    Hepatitis C screen  08/07/2022 (Originally 03/10/2008)    COVID-19 Vaccine (1) 10/04/2025 (Originally [birth year]/1990)    Depression Screen  08/07/2022    HIV screen  Completed    Hepatitis A vaccine  Aged Out    Hib vaccine  Aged Out    Meningococcal (ACWY) vaccine  Aged Out    Pneumococcal 0-64 years Vaccine  Aged Out    Varicella vaccine  Discontinued      Self-testicular exams: {YES / XW:96045}  Sexual activity: {Persons; sexual partners:705}   Last eye exam: ***, {NORMAL/ABNORMAL:304500093}  Exercise: {EXERCISE WUJW:119147829}  Seatbelt use: ***       Preventive plan of care for Troy Church        10/15/2021           Preventive Measures Status       Recommendations for screening   Prostate Cancer Screen  No results found for: PSA   {Screening:19963:p}    Colon Cancer Screen  Last colonoscopy: *** {Screening:19963:p}   Diabetes Screen  Glucose (mg/dL)   Date Value   56/21/3086 104 (H)    {Screening:19963:p}   Cholesterol Screen  Lab Results   Component Value Date    CHOL 179 08/07/2018    TRIG 125 08/07/2018    HDL 40 08/07/2018    LDLCALC 114 (H) 08/07/2018    {Screening:19963:p}   Aspirin for Cardiovascular Prevention   {YES / NO:19727} {CARDIOVASCULAR PREVENTION, VHQIONG:29528}   Weight: There is no height or weight on file to calculate BMI.      {  WEIGHT SCREENING:20090}   Living Will: {YES / KV:42595} {LIVING WILL SCREENING:20092}    Recommended Immunizations      There is no immunization history on file  for this patient. {RECOMMENDED VACCINES:20723:p}         Other Recommendations   {PLAN OF CARE OTHER RECOMMENDATIONS:20017:p}             Assessment/Plan:  Troy Church was seen today for annual exam.    Diagnoses and all orders for this visit:    Lipid screening

## 2021-10-15 ENCOUNTER — Ambulatory Visit: Payer: PRIVATE HEALTH INSURANCE | Attending: Family | Primary: Family

## 2021-10-15 DIAGNOSIS — Z13220 Encounter for screening for lipoid disorders: Secondary | ICD-10-CM

## 2021-10-19 NOTE — Progress Notes (Signed)
No show

## 2021-11-26 ENCOUNTER — Ambulatory Visit
Admit: 2021-11-26 | Discharge: 2021-11-26 | Payer: PRIVATE HEALTH INSURANCE | Attending: Student in an Organized Health Care Education/Training Program | Primary: Family

## 2021-11-26 DIAGNOSIS — J309 Allergic rhinitis, unspecified: Secondary | ICD-10-CM

## 2021-11-26 MED ORDER — AZELASTINE HCL 0.1 % NA SOLN
0.1 % | Freq: Two times a day (BID) | NASAL | 1 refills | Status: DC
Start: 2021-11-26 — End: 2022-01-09

## 2021-11-26 MED ORDER — LORATADINE 10 MG PO TABS
10 MG | ORAL_TABLET | Freq: Every day | ORAL | 4 refills | Status: DC
Start: 2021-11-26 — End: 2022-01-09

## 2021-11-26 MED ORDER — FLUTICASONE PROPIONATE 50 MCG/ACT NA SUSP
50 MCG/ACT | Freq: Every day | NASAL | 1 refills | Status: DC
Start: 2021-11-26 — End: 2022-01-09

## 2021-11-26 NOTE — Consults (Signed)
Session ID: [id number]  Request ID: [id number]  Language: Nepali  Status: Fulfilled  Interpreter ID: #[id number]  Interpreter Name: Louretta Shorten

## 2021-11-26 NOTE — Consults (Signed)
Session ID: [id number]  Request ID: [id number]  Language: Nepali  Status: Fulfilled  Interpreter ID: #[id number]  Interpreter Name: Sachin

## 2021-11-26 NOTE — Progress Notes (Signed)
[facility]  DIVISION OF OTOLARYNGOLOGY- HEAD & NECK SURGERY  NEW PATIENT HISTORY AND PHYSICAL NOTE      Patient Name: Troy Church  Medical Record Number:  [id number]  Primary Care Physician:  Constance Holster, APRN - CNP    ChiefComplaint     Chief Complaint   Patient presents with    Other     Blood from nose, dry mouth, throat is dry, hard to breath through the nose,        History of Present Illness     Troy Church is an 31 y.o. male presenting with nasal congestion.  He has had chronic nasal congestion that has been going on for months.  He also has intermittent shortness of breath not related to his nasal congestion.  He has never seen a pulmonologist.  Denies environmental or seasonal allergies. Was prescribed Flonase by PCP end of March, not using daily only as needed. Does feel like it is helping with his nasal congestion when he uses it. No hx of asthma.     + reflux, took prilosec 20 mg daily for 1 month, did not seem to help much.     H&P performed with the use of Nepali interpreter.     Past Medical History     Past Medical History:   Diagnosis Date    GERD (gastroesophageal reflux disease)        Past Surgical History     No past surgical history on file.    Family History     Family History   Problem Relation Age of Onset    No Known Problems Mother     No Known Problems Father     No Known Problems Sister     No Known Problems Brother     No Known Problems Brother        Social History     Social History     Tobacco Use    Smoking status: Former     Packs/day: 0.50     Years: 2.00     Pack years: 1.00     Types: Cigarettes     Start date: 04/17/2005     Quit date: 08/07/2021     Years since quitting: 0.3    Smokeless tobacco: Never   Vaping Use    Vaping Use: Every day   Substance Use Topics    Alcohol use: Not Currently    Drug use: Yes     Types: Marijuana Sheran Fava)     Comment: occasionally        Allergies     No Known Allergies    Medications     Current Outpatient Medications   Medication Sig Dispense  Refill    loratadine (CLARITIN) 10 MG tablet Take 1 tablet by mouth daily 30 tablet 4    azelastine (ASTELIN) 0.1 % nasal spray 2 sprays by Nasal route 2 times daily Use in each nostril as directed 120 mL 1    fluticasone (FLONASE) 50 MCG/ACT nasal spray 2 sprays by Each Nostril route daily 48 g 1    dicyclomine (BENTYL) 10 MG capsule Take 1 capsule by mouth 3 times daily as needed (abdominal cramping) 20 capsule 1    omeprazole (PRILOSEC) 20 MG delayed release capsule Take 1 capsule by mouth every morning (before breakfast) (Patient not taking: Reported on 11/26/2021) 30 capsule 0    ibuprofen (IBU) 600 MG tablet Take 1 tablet by mouth every 8 hours as needed for  Pain 20 tablet 0     No current facility-administered medications for this visit.       Review of Systems     REVIEW OF SYSTEMS    See HPI Above    PhysicalExam     Vitals:    11/26/21 1542   BP: 118/73   Site: Left Upper Arm   Position: Sitting   Cuff Size: Medium Adult   Pulse: 77   Temp: 97.8 F (36.6 C)   TempSrc: Temporal   SpO2: 97%   Weight: 158 lb (71.7 kg)   Height: 5\' 8"  (1.727 m)       PHYSICAL EXAM  BP 118/73 (Site: Left Upper Arm, Position: Sitting, Cuff Size: Medium Adult)   Pulse 77   Temp 97.8 F (36.6 C) (Temporal)   Ht 5\' 8"  (1.727 m)   Wt 158 lb (71.7 kg)   SpO2 97%   BMI 24.02 kg/m     GENERAL: No acute distress, alert and oriented  EYES: EOMI, Anti-icteric  NOSE: On anterior rhinoscopy there is no epistaxis, nasal mucosa moist and normal appearing, no purulent drainage.  Bilateral inferior turbinates severely hypertrophied.  EARS: Normal external appearance; on portable otomicroscopy:     -Ad: External auditory canal without stenosis, tympanic membrane clear, no middle ear effusions or retractions.      -As: External auditory canal without stenosis, tympanic membrane clear, no middle ear effusions or retractions.   Pneumatic otoscopy: Bilateral tympanic membranes mobile pneumatic otoscopy  FACE: HB 1/6 bilaterally, symmetric  appearing, sensation equal bilaterally  ORAL CAVITY: No masses or lesions visualized or palpated, uvula is midline, moist mucous membranes, no oropharyngeal masses or oropharyngeal obstruction. Tonsils 2+ bilaterally.   NECK: Normal range of motion, no thyromegaly, trachea is midline, no palpable lymphadenopathy or neck masses, no crepitus  NEURO: Cranial Nerves 2, 3, 4, 5, 6, 7, 11, 12 grossly intact bilaterally     I have performed a head and neck physical exam personally or was physically present during the key or critical portions of the service.    Procedure     Procedure: Flexible Laryngoscopy    Pre-op: Shortness of breath  Post-op: Same  Procedure : Flexible Nasopharyngolaryngoscopy  Surgeon: Dr. , DO  Anesthesia: Afrin with 2% lidocaine  Estimated Blood Loss: None    Description of Procedure:  After obtaining consent, the patient was placed in the examination chair in the upright position.  Decongestant and topical anesthetic was sprayed in the bilateral nares and after allowing adequate time for hemostatic effect, the flexible 4 mm laryngoscope was passed via the right nasal passage.    Nasal Septum: No acute septal deviation, no septal hematoma or perforations noted   Nasal Findings: No active hemorrhage, no nasal masses appreciated   Nasopharynx: Clear, no masses or inflammation  Oropharynx: No exophytic or ulcerative lesions, mucosa appears within normal limits  Base of Tongue: Lingual tonsils within normal limits, vallecula without effacement  Epiglottis: Upright, in normal anatomical position  True Vocal Cord: Anatomically normal, no masses or inflammation, normal abduction and adduction upon inspiration and phonation  False Vocal Cord: normal appearing without masses  Hypopharynx Mucosa: No masses or inflammation of the piriform sinuses or postcricoid area  Arytenoids: Normal mucosa, no dislocation appreciated     * Patient tolerated the procedure well with no complications   * Patient  was instructed not to eat for 30 minutes following procedure.   * Patient was instructed that they may notice  minor bleeding.    Assessment and Plan     1. Allergic rhinitis, unspecified seasonality, unspecified trigger  2. Hypertrophy of both inferior nasal turbinates  -Noted to have bilateral nasal polyps per PCP at his last appointment.  Rather than nasal polyps findings today consistent with severe edema of the bilateral inferior turbinate.  Start Flonase and Astelin nasal sprays daily.  -Start Claritin daily for underlying allergies    3. Shortness of breath  -Suspect this may be related to underlying nasal congestion making him feel short of breath.  No evidence of vocal fold paralysis on flexible fiberoptic laryngoscopy today, bilateral vocal cords with normal mobility in both inspiration and phonation.  Continue follow-up with PCP for ongoing management, consider pulmonology referral if this continues despite treatment of nasal congestion above.      Follow Up     Return if symptoms worsen or fail to improve.        Dr. Justice Deeds, DO  [facility]  Department of Otolaryngology/Head & Neck Surgery  11/26/21    Medical Decision Making:  The following items were considered in medical decision making:  Independent review of images  Review / order clinical lab tests  Review / order radiology tests  Decision to obtain old records    This note was generated completely or in part utilizing Dragon dictation speech recognition software.  Occasionally, words are mistranscribed and despite editing, the text may contain inaccuracies due to incorrect word recognition.  If further clarification is needed please contact the office at [phone].

## 2022-01-02 NOTE — Telephone Encounter (Signed)
NURSE TRIAGE SOB AND HEART IS RACING WHEN I TALKED TO PATIENT HE HAS HAD THE SAME SX SINCE MARCH, HE WAS SENT TO ENT AND ENT SENT HIM BACK TO [place]. ENT ADVISED PT AT APPT IN June TO COME BACK AND SEE PCP IN REGARDS TO HIS SYMPTOMS, HE IS CONGESTED IN HIS NOSE. FIRST AVAILABLE IS 8/7 AT 420 APPT SCHEDULED.KW

## 2022-01-08 DIAGNOSIS — R140 Abdominal distension (gaseous): Secondary | ICD-10-CM

## 2022-01-08 DIAGNOSIS — F411 Generalized anxiety disorder: Secondary | ICD-10-CM

## 2022-01-08 NOTE — ED Provider Notes (Signed)
The Ekg interpreted by me in the absence of a cardiologist shows.  Sinus bradycardia with a ventricular rate of 59.  Axis normal.  QTc appropriate.  No specific ST or T wave abnormality.  No prior for comparison.    I only performed EKG interpretation and was not otherwise involved in the care of this patient.         Dema Severin, MD  01/09/22 [phone]

## 2022-01-08 NOTE — ED Provider Notes (Signed)
[facility] EMERGENCY DEPARTMENT  EMERGENCY DEPARTMENT ENCOUNTER        Pt Name: Troy Church  MRN: [id number]  Birthdate 11/20/1990  Date of evaluation: 01/08/2022  Provider: Fabiola Backer, APRN - CNP  PCP: Constance Holster, APRN - CNP  Note Started: 12:51 AM EDT 01/09/22      APP. I have evaluated this patient.        CHIEF COMPLAINT       Chief Complaint   Patient presents with    Abdominal Pain     Pt arrives to ED via self c/o "abdominal discomfort", bloating, and indigestion starting 2 days ago, also c/o difficulty breathing that causes more abdominal pain, states it "feels like something is blocking my throat". Pt states he has a BM this morning, states it was "harder than normal", also states he saw some blood in stool 3 days ago, hasn't had blood in stool since then.    Triage completed with Interpreter [phone]       HISTORY OF PRESENT ILLNESS: 1 or more Elements     History from : Patient    Limitations to history : Language nepali    Troy Church is a 31 y.o. male who presents to the emergency department with many complaints.  Patient reports that he is experiencing bloating with belching, shortness of breath, nasal congestion, palpitations, anxiety, chest pain.  Reports that the symptoms have been ongoing for many months.  He has been seen by primary care as well as ENT.  The patient is noncompliant with the medications that have been prescribed, states that he is out of everything that he has been given.  He does have an upcoming appointment with PCP.    Patient reports that his heart is beating harder than it should causing anxiety.  He feels like something is stuck in his nose/throat causing shortness of breath.  Reports that when this happens that his heart beats harder than it should and he is anxious.  States that he has abdominal bloating with belching and hard stool.    The patient is requesting an EGD and colonoscopy in the emergency department tonight for someone to "look inside of  my stomach".    Goes on to report that since he is here he would like his palpitations to be checked out.    Nursing Notes were all reviewed and agreed with or any disagreements were addressed in the HPI.    REVIEW OF SYSTEMS :      Review of Systems   Constitutional:  Negative for activity change, chills and fever.   HENT:  Positive for congestion. Negative for dental problem.    Respiratory:  Positive for shortness of breath. Negative for chest tightness.    Cardiovascular:  Positive for chest pain and palpitations.   Gastrointestinal:  Positive for abdominal pain. Negative for diarrhea, nausea and vomiting.   Genitourinary:  Negative for dysuria.   Psychiatric/Behavioral:  The patient is nervous/anxious.    All other systems reviewed and are negative.    Positives and Pertinent negatives as per HPI.     SURGICAL HISTORY   History reviewed. No pertinent surgical history.    CURRENTMEDICATIONS       Discharge Medication List as of 01/09/2022  2:22 AM        CONTINUE these medications which have NOT CHANGED    Details   ibuprofen (IBU) 600 MG tablet Take 1 tablet by mouth every 8 hours as needed for  Pain, Disp-20 tablet, R-0Print             ALLERGIES     Patient has no known allergies.    FAMILYHISTORY       Family History   Problem Relation Age of Onset    No Known Problems Mother     No Known Problems Father     No Known Problems Sister     No Known Problems Brother     No Known Problems Brother         SOCIAL HISTORY       Social History     Tobacco Use    Smoking status: Former     Packs/day: 0.50     Years: 2.00     Pack years: 1.00     Types: Cigarettes     Start date: 04/17/2005     Quit date: 08/07/2021     Years since quitting: 0.4    Smokeless tobacco: Never   Vaping Use    Vaping Use: Former   Substance Use Topics    Alcohol use: Not Currently    Drug use: Not Currently     Types: Marijuana Sheran Fava)     Comment: occasionally       SCREENINGS        Glasgow Coma Scale  Eye Opening: Spontaneous  Best Verbal  Response: Oriented  Best Motor Response: Obeys commands  Glasgow Coma Scale Score: 15                CIWA Assessment  BP: 133/83  Pulse: 66           PHYSICAL EXAM  1 or more Elements     ED Triage Vitals [01/09/22 0020]   BP Temp Temp Source Pulse Respirations SpO2 Height Weight   133/83 97.9 F (36.6 C) Oral 66 18 99 % -- --       Physical Exam  Vitals and nursing note reviewed.   Constitutional:       Appearance: He is well-developed. He is not diaphoretic.   HENT:      Head: Normocephalic and atraumatic.      Right Ear: External ear normal.      Left Ear: External ear normal.   Eyes:      General:         Right eye: No discharge.         Left eye: No discharge.   Neck:      Vascular: No JVD.   Cardiovascular:      Rate and Rhythm: Normal rate and regular rhythm.      Pulses: Normal pulses.      Heart sounds: Normal heart sounds.   Pulmonary:      Effort: Pulmonary effort is normal. No respiratory distress.      Breath sounds: Normal breath sounds.   Abdominal:      Palpations: Abdomen is soft.   Musculoskeletal:         General: Normal range of motion.   Skin:     General: Skin is warm and dry.      Coloration: Skin is not pale.   Neurological:      Mental Status: He is alert.   Psychiatric:         Mood and Affect: Mood is anxious.         Behavior: Behavior normal.           DIAGNOSTIC RESULTS   LABS:    Labs  Reviewed   COMPREHENSIVE METABOLIC PANEL - Abnormal; Notable for the following components:       Result Value    Creatinine 0.6 (*)     All other components within normal limits   TROPONIN   D-DIMER, QUANTITATIVE   CBC WITH AUTO DIFFERENTIAL       When ordered only abnormal lab results are displayed. All other labs were within normal range or not returned as of this dictation.    EKG: When ordered, EKG's are interpreted by the Emergency Department Physician in the absence of a cardiologist.  Please see their note for interpretation of EKG.    RADIOLOGY:   Non-plain film images such as CT, Ultrasound and  MRI are read by the radiologist. Plain radiographic images are visualized and preliminarily interpreted by the ED Provider with the below findings:        Interpretation per the Radiologist below, if available at the time of this note:    XR CHEST PORTABLE   Final Result   No significant findings in the chest.           No results found.    No results found.    PROCEDURES   Unless otherwise noted below, none     Procedures    CRITICAL CARE TIME (.cctime)   none    PAST MEDICAL HISTORY      has a past medical history of GERD (gastroesophageal reflux disease).     Chronic Conditions affecting Care: GERD    EMERGENCY DEPARTMENT COURSE and DIFFERENTIAL DIAGNOSIS/MDM:   Vitals:    Vitals:    01/09/22 0020   BP: 133/83   Pulse: 66   Resp: 18   Temp: 97.9 F (36.6 C)   TempSrc: Oral   SpO2: 99%       Patient was given the following medications:  Medications   aluminum & magnesium hydroxide-simethicone (MAALOX) 30 mL, lidocaine viscous hcl (XYLOCAINE) 5 mL (GI COCKTAIL) ( Oral Given 01/09/22 0118)   dicyclomine (BENTYL) injection 20 mg (20 mg IntraMUSCular Given 01/09/22 0122)             Is this patient to be included in the SEP-1 Core Measure due to severe sepsis or septic shock?   No   Exclusion criteria - the patient is NOT to be included for SEP-1 Core Measure due to:  Infection is not suspected    CONSULTS: (Who and What was discussed)  None  Discussion with Other Profesionals : None    Social Determinants : no engligh    Records Reviewed : Outpatient Notes ENT outpatient visit 11/2021, PCP multiple visits for chronic conditions    CC/HPI Summary, DDx, ED Course, and Reassessment:     Briefly, this is a 31 year old male who presents to the emergency department with many complaints.  Patient reports that he is experiencing bloating with belching, shortness of breath, nasal congestion, palpitations, anxiety, chest pain.  Reports that the symptoms have been ongoing for many months.  He has been seen by primary care as well  as ENT.  The patient is noncompliant with the medications that have been prescribed, states that he is out of everything that he has been given.  He does have an upcoming appointment with PCP.    Patient reports that his heart is beating harder than it should causing anxiety.  He feels like something is stuck in his nose/throat causing shortness of breath.  Reports that when this happens that his heart  beats harder than it should and he is anxious.  States that he has abdominal bloating with belching and hard stool.    The patient is requesting an EGD and colonoscopy in the emergency department tonight for someone to "look inside of my stomach".    Goes on to report that since he is here he would like his palpitations to be checked out.    Labs are unremarkable, negative cxr.  Ekg was interpreted by attending physician.    HEART Score ?3 and 1 negative troponin - No hospitalization indicated    I completed a HEART Score to screen for Major Adverse Cardiac Event (MACE) in this patient. The evidence indicates that the patient is low risk for MACE and this is consistent with my clinical intuition.    The risk of further workup or hospitalization for MACE is likely higher than the risk of the patient having a MACE. It is, therefore, in the patient's best interest not to do additional emergent testing or to be hospitalized for MACE.    I have discussed with the patient my clinical impression and the result of the HEART Score to screen for MACE, as well as the risks of further testing and hospitalization. The HEART Score shows that the risk for MACE is less than 2%      The patient has been evaluated and the history and physical exam suggest a benign etiology. I see nothing to suggest acute coronary syndrome, myocardial infarction, pulmonary embolism, thoracic aortic dissection, significant pericarditis, pneumonia, pneumothorax, or acute abdomen.  I feel the patient can be safely discharged to home with outpatient follow  up.  Instructions have been given for the patient to return to the Emergency Department for any worsening of the symptoms, including but not limited to increased pain, shortness of breath, abdominal pain or weakness.    Disposition Considerations (include 1 Tests not done, Shared Decision Making, Pt Expectation of Test or Tx.): shared decision making used throughout    I did consider ct pulmonary    Appropriate for outpatient management follow up      I am the Primary Clinician of Record.    FINAL IMPRESSION      1. Abdominal bloating    2. Gastroesophageal reflux disease, unspecified whether esophagitis present    3. Belching    4. Nasal congestion    5. Anxiety state    6. Palpitations    7. Shortness of breath          DISPOSITION/PLAN     DISPOSITION Decision To Discharge 01/09/2022 02:20:48 AM      PATIENT REFERRED TO:  Constance Holster, APRN - CNP  [address]  [place] [address]  [phone]    Go to   your appointment this week    Camila Li, MD  [address]  [address]  [place] [address]  [phone]    Schedule an appointment as soon as possible for a visit         DISCHARGE MEDICATIONS:  Discharge Medication List as of 01/09/2022  2:22 AM          DISCONTINUED MEDICATIONS:  Discharge Medication List as of 01/09/2022  2:22 AM                 (Please note that portions of this note were completed with a voice recognition program.  Efforts were made to edit the dictations but occasionally words are mis-transcribed.)    Gwendalyn Ege  Bascom Levels, APRN - CNP (electronically signed)           Fabiola Backer, APRN - CNP  01/09/22 [phone]

## 2022-01-09 ENCOUNTER — Emergency Department: Admit: 2022-01-09 | Primary: Family

## 2022-01-09 ENCOUNTER — Inpatient Hospital Stay: Admit: 2022-01-09 | Discharge: 2022-01-09 | Disposition: A | Discharge status: Home / Self Care

## 2022-01-09 LAB — CBC WITH AUTO DIFFERENTIAL
Basophils %: 0.5 %
Basophils Absolute: 0 10*3/uL (ref 0.0–0.2)
Eosinophils %: 0.8 %
Eosinophils Absolute: 0.1 10*3/uL (ref 0.0–0.6)
Hematocrit: 49.5 % (ref 40.5–52.5)
Hemoglobin: 16.4 g/dL (ref 13.5–17.5)
Lymphocytes %: 27.8 %
Lymphocytes Absolute: 2.1 10*3/uL (ref 1.0–5.1)
MCH: 28.3 pg (ref 26.0–34.0)
MCHC: 33.2 g/dL (ref 31.0–36.0)
MCV: 85.2 fL (ref 80.0–100.0)
MPV: 8.2 fL (ref 5.0–10.5)
Monocytes %: 9.5 %
Monocytes Absolute: 0.7 10*3/uL (ref 0.0–1.3)
Neutrophils %: 61.4 %
Neutrophils Absolute: 4.7 10*3/uL (ref 1.7–7.7)
Platelets: 283 10*3/uL (ref 135–450)
RBC: 5.8 M/uL (ref 4.20–5.90)
RDW: 13.1 % (ref 12.4–15.4)
WBC: 7.6 10*3/uL (ref 4.0–11.0)

## 2022-01-09 LAB — EKG 12-LEAD
Atrial Rate: 59 {beats}/min
P Axis: 33 degrees
P-R Interval: 176 ms
Q-T Interval: 404 ms
QRS Duration: 110 ms
QTc Calculation (Bazett): 399 ms
R Axis: 71 degrees
T Axis: 26 degrees
Ventricular Rate: 59 {beats}/min

## 2022-01-09 LAB — D-DIMER, QUANTITATIVE: D-Dimer, Quant: <0.27 ug/mL FEU (ref 0.00–0.60)

## 2022-01-09 LAB — COMPREHENSIVE METABOLIC PANEL
ALT: 37 U/L (ref 10–40)
AST: 31 U/L (ref 15–37)
Albumin/Globulin Ratio: 1.7 (ref 1.1–2.2)
Albumin: 4.8 g/dL (ref 3.4–5.0)
Alkaline Phosphatase: 69 U/L (ref 40–129)
Anion Gap: 10 (ref 3–16)
BUN: 8 mg/dL (ref 7–20)
CO2: 27 mmol/L (ref 21–32)
Calcium: 9.8 mg/dL (ref 8.3–10.6)
Chloride: 100 mmol/L (ref 99–110)
Creatinine: 0.6 mg/dL — ABNORMAL LOW (ref 0.9–1.3)
Est, Glom Filt Rate: >60 (ref >60)
Glucose: 95 mg/dL (ref 70–99)
Potassium: 3.5 mmol/L (ref 3.5–5.1)
Sodium: 137 mmol/L (ref 136–145)
Total Bilirubin: 0.8 mg/dL (ref 0.0–1.0)
Total Protein: 7.7 g/dL (ref 6.4–8.2)

## 2022-01-09 LAB — TROPONIN: Troponin, High Sensitivity: <6 ng/L (ref 0–22)

## 2022-01-09 MED ORDER — LIDOCAINE VISCOUS HCL 2 % MT SOLN
2 | Freq: Once | OROMUCOSAL | Status: AC
Start: 2022-01-09 — End: 2022-01-09
  Administered 2022-01-09: 05:00:00 via ORAL

## 2022-01-09 MED ORDER — AZELASTINE HCL 0.1 % NA SOLN
0.1 % | Freq: Two times a day (BID) | NASAL | 1 refills | Status: AC
Start: 2022-01-09 — End: ?

## 2022-01-09 MED ORDER — FLUTICASONE PROPIONATE 50 MCG/ACT NA SUSP
50 MCG/ACT | Freq: Every day | NASAL | 1 refills | Status: AC
Start: 2022-01-09 — End: ?

## 2022-01-09 MED ORDER — DICYCLOMINE HCL 10 MG PO CAPS
10 MG | ORAL_CAPSULE | Freq: Three times a day (TID) | ORAL | 1 refills | Status: AC | PRN
Start: 2022-01-09 — End: ?

## 2022-01-09 MED ORDER — DICYCLOMINE HCL 10 MG/ML IM SOLN
10 MG/ML | Freq: Once | INTRAMUSCULAR | Status: AC
Start: 2022-01-09 — End: 2022-01-09
  Administered 2022-01-09: 05:00:00 20 mg via INTRAMUSCULAR

## 2022-01-09 MED ORDER — OMEPRAZOLE 20 MG PO CPDR
20 MG | ORAL_CAPSULE | Freq: Every day | ORAL | 0 refills | Status: AC
Start: 2022-01-09 — End: ?

## 2022-01-09 MED ORDER — LORATADINE 10 MG PO TABS
10 MG | ORAL_TABLET | Freq: Every day | ORAL | 4 refills | Status: AC
Start: 2022-01-09 — End: ?

## 2022-01-09 MED FILL — MAG-AL PLUS 200-200-20 MG/5ML PO LIQD: 200-200-20 MG/5ML | ORAL | Qty: 30

## 2022-01-09 MED FILL — DICYCLOMINE HCL 10 MG/ML IM SOLN: 10 MG/ML | INTRAMUSCULAR | Qty: 2

## 2022-01-09 NOTE — Discharge Instructions (Signed)
Please take your prescriptions as prescribed

## 2022-01-09 NOTE — Consults (Signed)
Session ID: [id number]  Language: Nepali  Interpreter ID: [id number]  Interpreter Name: Ayesha Rumpf

## 2022-01-14 ENCOUNTER — Ambulatory Visit: Payer: PRIVATE HEALTH INSURANCE | Attending: Family | Primary: Family

## 2022-01-14 NOTE — Patient Instructions (Signed)
You may receive a survey regarding the care you received during your visit.  Your input is valuable to us.  We encourage you to complete and return your survey.  We hope you will choose us in the future for your healthcare needs.  GENERAL OFFICE POLICIES      Telephone Calls: Messages will be answered within 1-2 business days, unless the provider is out of the office.  If it is urgent a covering provider will answer. (this does not include Medication refills).    MyChart:  We recommend all patients sign up for MyChart.  Through this portal you can see your lab results, request refills, schedule appointments, pay your bill and send messages to the office.   MyChart messages will be answered within 1-2 business days unless the provider is out of the office.  For urgent matters, please call the office.  Appointments:  All appointments must be scheduled.  We ask all patients to schedule their next follow up appointment before they leave the office to make sure you will be able to be seen before you run out of medications.  24 hours notice is required to cancel or reschedule an appointment to avoid being marked as a no show.  You may be dismissed from the practice after 3 no shows.    LATE for Appointment: If you are 15 or more minutes late for your appointment, you may be asked to reschedule.  MA/LAB APPTS: Must be scheduled, cannot accept walk in lab visits.  We only draw labs for patients established in our office.  We only do injections for medications ordered by our office.  Acute Sick Visits:  Nothing other than acute complaint will be addressed at this visit.  TRADITIONAL MEDICARE  DOES NOT COVER PHYSICALS  MEDICARE WELLNESS VISITS: These are NOT physicals but the free annual visit offered by Medicare to discuss wellness issues. Medication refills, checkups, etc. will not be addressed during this visit.  Medication Refills: Refills are handled electronically so please contact your pharmacy for medication refills  even if current refills have been exhausted. If you are on a controlled medication you will be referred to a specialist (pain specialist, psychiatry, etc).   Forms: There is a $35 fee to fill out FMLA/Disability paperwork, payable at time of pick up. Instead of the fee, you can choose to have the paperwork filled out during a separate office visit that is for filling out the paperwork only.  Medication Samples:  This office does not carry medication samples.  If you need assistance in getting your medications, then please let the medical assistant know so they can help you sign up for a drug assistance program that can help get medications at a reduced cost or even free (if you qualify).  Workman's Comp Claims: We do not handle workman's comp cases or claims. You will need to go to an urgent care to be seen or to whomever your employer uses.  General - Any abusive/rude behavior toward staff/providers may be cause for dismissal.

## 2022-01-15 NOTE — Progress Notes (Signed)
No show

## 2022-02-27 ENCOUNTER — Inpatient Hospital Stay
Admit: 2022-02-27 | Discharge: 2022-02-27 | Disposition: A | Discharge status: Home / Self Care | Attending: Emergency Medicine

## 2022-02-27 DIAGNOSIS — R1013 Epigastric pain: Secondary | ICD-10-CM

## 2022-02-27 LAB — BASIC METABOLIC PANEL
Anion Gap: 9 (ref 3–16)
BUN: 9 mg/dL (ref 7–20)
CO2: 26 mmol/L (ref 21–32)
Calcium: 9.7 mg/dL (ref 8.3–10.6)
Chloride: 103 mmol/L (ref 99–110)
Creatinine: 0.7 mg/dL — ABNORMAL LOW (ref 0.9–1.3)
Est, Glom Filt Rate: >60 (ref >60)
Glucose: 139 mg/dL — ABNORMAL HIGH (ref 70–99)
Potassium: 3.5 mmol/L (ref 3.5–5.1)
Sodium: 138 mmol/L (ref 136–145)

## 2022-02-27 LAB — HEPATIC FUNCTION PANEL
ALT: 47 U/L — ABNORMAL HIGH (ref 10–40)
AST: 32 U/L (ref 15–37)
Albumin: 4.5 g/dL (ref 3.4–5.0)
Alkaline Phosphatase: 74 U/L (ref 40–129)
Bilirubin, Direct: <0.2 mg/dL (ref 0.0–0.3)
Total Bilirubin: 0.9 mg/dL (ref 0.0–1.0)
Total Protein: 7.6 g/dL (ref 6.4–8.2)

## 2022-02-27 LAB — CBC WITH AUTO DIFFERENTIAL
Basophils %: 0.5 %
Basophils Absolute: 0 10*3/uL (ref 0.0–0.2)
Eosinophils %: 2.2 %
Eosinophils Absolute: 0.1 10*3/uL (ref 0.0–0.6)
Hematocrit: 45.8 % (ref 40.5–52.5)
Hemoglobin: 15.5 g/dL (ref 13.5–17.5)
Lymphocytes %: 38.8 %
Lymphocytes Absolute: 2.4 10*3/uL (ref 1.0–5.1)
MCH: 28.1 pg (ref 26.0–34.0)
MCHC: 33.8 g/dL (ref 31.0–36.0)
MCV: 83.2 fL (ref 80.0–100.0)
MPV: 8 fL (ref 5.0–10.5)
Monocytes %: 8.7 %
Monocytes Absolute: 0.5 10*3/uL (ref 0.0–1.3)
Neutrophils %: 49.8 %
Neutrophils Absolute: 3 10*3/uL (ref 1.7–7.7)
Platelets: 263 10*3/uL (ref 135–450)
RBC: 5.51 M/uL (ref 4.20–5.90)
RDW: 13 % (ref 12.4–15.4)
WBC: 6.1 10*3/uL (ref 4.0–11.0)

## 2022-02-27 LAB — LIPASE: Lipase: 29 U/L (ref 13.0–60.0)

## 2022-02-27 MED ORDER — DICYCLOMINE HCL 10 MG PO CAPS
10 MG | Freq: Once | ORAL | Status: AC
Start: 2022-02-27 — End: 2022-02-27
  Administered 2022-02-27: 10:00:00 20 mg via ORAL

## 2022-02-27 MED ORDER — FAMOTIDINE (PF) 20 MG/2ML IV SOLN
20 MG/2ML | Freq: Once | INTRAVENOUS | Status: AC
Start: 2022-02-27 — End: 2022-02-27
  Administered 2022-02-27: 10:00:00 20 mg via INTRAVENOUS

## 2022-02-27 MED ORDER — SUCRALFATE 1 G PO TABS
1 GM | Freq: Once | ORAL | Status: AC
Start: 2022-02-27 — End: 2022-02-27
  Administered 2022-02-27: 10:00:00 1 g via ORAL

## 2022-02-27 MED ORDER — PANTOPRAZOLE SODIUM 20 MG PO TBEC
20 MG | ORAL_TABLET | Freq: Every day | ORAL | 0 refills | Status: AC
Start: 2022-02-27 — End: ?

## 2022-02-27 MED FILL — SUCRALFATE 1 G PO TABS: 1 GM | ORAL | Qty: 1

## 2022-02-27 MED FILL — DICYCLOMINE HCL 10 MG PO CAPS: 10 MG | ORAL | Qty: 2

## 2022-02-27 MED FILL — FAMOTIDINE (PF) 20 MG/2ML IV SOLN: 20 MG/2ML | INTRAVENOUS | Qty: 2

## 2022-02-27 NOTE — ED Provider Notes (Signed)
[facility] Emergency Department      Pt Name: Troy Church  MRN: [id number]  [place] 10-07-1990  Date of evaluation: 02/27/2022  Provider: Uvaldo Rising, MD  CHIEF COMPLAINT  Chief Complaint   Patient presents with    Abdominal Pain     Pt states he has been having abd pain and burning this morning denies n/v states this has been going on for mths when he eats spicy food, but this morning it was worse rates pain 10/10      HPI  Troy Church is a 31 y.o. male who presents because of abdominal pain.  He has a burning discomfort in the upper part of the abdomen.  He has had it for multiple months in association with eating food although this discomfort this morning is worse.  He denies any nausea or vomiting.  Denies any fever or chills.  Has no history of abdominal surgeries.  Denies any change in bowel movements or urination.  From chart review, he was seen here over a month ago for similar symptoms.  He was prescribed medications.  He indicates the medication did not help although he is not currently taking any medicine.  He denies any recent use of alcohol.    REVIEW OF SYSTEMS:  No fever, no breathing difficulty, no dysuria Pertinent positives and negatives as per the HPI.  All other pertinent review of systems reviewed and negative.  Nursing notes reviewed.    PAST MEDICAL HISTORY:  Past Medical History:   Diagnosis Date    GERD (gastroesophageal reflux disease)      SURGICAL HISTORY  History reviewed. No pertinent surgical history.  MEDICATIONS:  No current facility-administered medications on file prior to encounter.     Current Outpatient Medications on File Prior to Encounter   Medication Sig Dispense Refill    dicyclomine (BENTYL) 10 MG capsule Take 1 capsule by mouth 3 times daily as needed (abdominal cramping) (Patient not taking: Reported on 02/27/2022) 20 capsule 1    azelastine (ASTELIN) 0.1 % nasal spray 2 sprays by Nasal route 2 times daily Use in each nostril as directed (Patient not taking: Reported on  02/27/2022) 120 mL 1    fluticasone (FLONASE) 50 MCG/ACT nasal spray 2 sprays by Each Nostril route daily (Patient not taking: Reported on 02/27/2022) 48 g 1    loratadine (CLARITIN) 10 MG tablet Take 1 tablet by mouth daily (Patient not taking: Reported on 02/27/2022) 30 tablet 4    omeprazole (PRILOSEC) 20 MG delayed release capsule Take 1 capsule by mouth every morning (before breakfast) (Patient not taking: Reported on 02/27/2022) 30 capsule 0    ibuprofen (IBU) 600 MG tablet Take 1 tablet by mouth every 8 hours as needed for Pain 20 tablet 0     ALLERGIES  Patient has no known allergies.  FAMILY HISTORY:  Family History   Problem Relation Age of Onset    No Known Problems Mother     No Known Problems Father     No Known Problems Sister     No Known Problems Brother     No Known Problems Brother      SOCIAL HISTORY:  Social History     Tobacco Use    Smoking status: Former     Packs/day: 0.50     Years: 2.00     Additional pack years: 0.00     Total pack years: 1.00     Types: Cigarettes     Start date: 04/17/2005  Quit date: 08/07/2021     Years since quitting: 0.5    Smokeless tobacco: Never   Vaping Use    Vaping Use: Former   Substance Use Topics    Alcohol use: Not Currently    Drug use: Not Currently     Types: Marijuana Sheran Fava)     Comment: occasionally     IMMUNIZATIONS:  Noncontributory    PHYSICAL EXAM  VITAL SIGNS:  BP 117/81   Pulse 60   Temp 97.6 F (36.4 C) (Oral)   Resp 20   Ht 5\' 8"  (1.727 m)   Wt 155 lb (70.3 kg)   SpO2 100%   BMI 23.57 kg/m   Constitutional:  31 y.o. male cooperative  HENT:  Atraumatic, mucous membranes moist  Eyes:   Conjunctiva clear, no icterus  Neck:  Supple, no adenopathy  Cardiovascular:  Regular, no rubs  Thorax & Lungs:  No accessory muscle usage, clear  Abdomen:  Soft, non distended, bowel sounds present, no tenderness  Back:  No deformity  Genitalia:  Deferred  Rectal:  Deferred  Extremities:  No cyanosis, no edema  Skin:  Warm, dry, no rash of exposed  areas  Neurologic:  Alert, no slurred speech  Psychiatric:  Affect appropriate    RESULTS / MEDICAL DECISION MAKING:  Labs resulted at the time of this note reviewed.   Labs Reviewed   BASIC METABOLIC PANEL - Abnormal; Notable for the following components:       Result Value    Glucose 139 (*)     Creatinine 0.7 (*)     All other components within normal limits   HEPATIC FUNCTION PANEL - Abnormal; Notable for the following components:    ALT 47 (*)     All other components within normal limits   CBC WITH AUTO DIFFERENTIAL   LIPASE     RADIOLOGY:  None     ED COURSE:    Medications   famotidine (PEPCID) injection 20 mg (20 mg IntraVENous Given 02/27/22 0605)   sucralfate (CARAFATE) tablet 1 g (1 g Oral Given 02/27/22 0607)   dicyclomine (BENTYL) capsule 20 mg (20 mg Oral Given 02/27/22 0607)     History from:  Patient  Limitations to history:  None  Chronic Conditions:  has a past medical history of GERD (gastroesophageal reflux disease).  Records Reviewed:  Outpatient notes, prior test results and ED visit  Disposition Considerations (Tests not ordered but considered, Shared Decision Making, Pt Expectation of Test or Tx.):  MRI or CT not deemed clinically necessary in the ED setting    PROCEDURES:  None    CRITICAL CARE:  None    CONSULTATIONS:  None    Troy Church is a 31 y.o. male who presented because of abdominal pain.  The patient is feeling improvement after treatment.  The patient requests a note for work and also had some disability paperwork which is not paperwork and ED physician fills out.  I will prescribe alternative medication to see if he gets better relief until follow-up.  He was referred to GI, Dr 38, at his last ED visit.  Based on history, physical exam, and testing my suspicion is low for bowel or biliary obstruction, cholangitis, perforated viscous, appendicitis, gonad torsion, vascular occlusion or dissection, mesenteric ischemia, ACS amongst other emergent conditions.  Troy Church was given  appropriate discharge instructions.  Referral to follow up provider.    New Prescriptions    PANTOPRAZOLE (PROTONIX) 20 MG TABLET  Take 1 tablet by mouth daily     FOLLOW UP:    Constance Holster, APRN - CNP  [address]  [place] [address]  [phone]    Schedule an appointment as soon as possible for a visit       FINAL IMPRESSION:    1. Abdominal pain, epigastric        (Please note that I used voice recognition software to generate this note.  Occasionally words are mistranscribed despite my efforts to edit errors.)        Riesa Pope, MD  02/27/22 [phone]

## 2022-02-27 NOTE — ED Provider Notes (Signed)
Physician-In-Triage Note    I performed a medical screening examination and evaluated this patient briefly with the purpose of initiating their ED workup in an expeditious manner.  Please see notes from other ED providers regarding comprehensive evaluation including full history, physical exam, interpretation of results, and medical decision making/disposition.    In brief, Troy Church is a 31 y.o. male who presents to the ED complaining of generalized abdominal pain of which she describes as burning.  Reports that pain has been going on for months, but was worse this morning.  He has not taken any medication for pain relief.    Focused physical examination notable for no acute distress, well-appearing, well-nourished, normal speech and mentation without obvious facial droop, no obvious rash.  No obvious cranial nerve deficits on my initial exam.  No focal abdominal tenderness present.     Vitals reviewed per nursing documentation.    Triage orders placed, including CBC, BMP, hepatic function panel and lipase.    I did not personally review any of the results of these tests, which will be reviewed and interpreted later by ED providers at the time of the patient's more comprehensive evaluation.       Allegra Lai, DO  02/27/22 [phone]

## 2023-02-24 ENCOUNTER — Ambulatory Visit: Admit: 2023-02-24 | Discharge: 2023-02-24 | Payer: PRIVATE HEALTH INSURANCE | Attending: Family | Primary: Family

## 2023-02-24 VITALS — BP 112/70 | HR 59 | Ht 67.0 in | Wt 177.0 lb

## 2023-02-24 DIAGNOSIS — Z0000 Encounter for general adult medical examination without abnormal findings: Secondary | ICD-10-CM

## 2023-02-24 MED ORDER — FLUTICASONE PROPIONATE 50 MCG/ACT NA SUSP
50 | Freq: Every day | NASAL | 1 refills | Status: DC
Start: 2023-02-24 — End: 2023-03-31

## 2023-02-24 MED ORDER — OMEPRAZOLE 20 MG PO CPDR
20 | ORAL_CAPSULE | Freq: Every day | ORAL | 1 refills | Status: AC
Start: 2023-02-24 — End: ?

## 2023-02-24 NOTE — Patient Instructions (Signed)
 Well Visit, Ages 98 to 26: Care Instructions  Well visits can help you stay healthy. Your doctor has checked your overall health and may have suggested ways to take good care of yourself. Your doctor also may have recommended tests. You can help prevent illness with healthy eating, good sleep, vaccinations, regular exercise, and other steps.    Get the tests that you and your doctor decide on. Depending on your age and risks, examples might include screening for diabetes; hepatitis C; HIV; and cervical, breast, lung, and colon cancer. Screening helps find diseases before any symptoms appear.   Eat healthy foods. Choose fruits, vegetables, whole grains, lean protein, and low-fat dairy foods. Limit saturated fat and reduce salt.     Limit alcohol. Men should have no more than 2 drinks a day. Women should have no more than 1. For some people, no alcohol is the best choice.   Exercise. Get at least 30 minutes of exercise on most days of the week. Walking can be a good choice.     Reach and stay at your healthy weight. This will lower your risk for many health problems.   Take care of your mental health. Try to stay connected with friends, family, and community, and find ways to manage stress.     If you're feeling depressed or hopeless, talk to someone. A counselor can help. If you don't have a counselor, talk to your doctor.   Talk to your doctor if you think you may have a problem with alcohol or drug use. This includes prescription medicines, marijuana, and other drugs.     Avoid tobacco and nicotine: Don't smoke, vape, or chew. If you need help quitting, talk to your doctor.   Practice safer sex. Getting tested, using condoms or dental dams, and limiting sex partners can help prevent STIs.     Use birth control if it's important to you to prevent pregnancy. Talk with your doctor about your choices and what might be best for you.   Prevent problems where you can. Protect your skin from too much sun, wash your  hands, brush your teeth twice a day, and wear a seat belt in the car.   Where can you learn more?  Go to [employer] and enter P072 to learn more about "Well Visit, Ages 55 to 50: Care Instructions."  Current as of: January 13, 2022  Content Version: 14.1   2006-2024 [employer].   Care instructions adapted under license by [facility]. If you have questions about a medical condition or this instruction, always ask your healthcare professional. [employer] disclaims any warranty or liability for your use of this information.

## 2023-02-24 NOTE — Progress Notes (Signed)
 Well Adult Note  Name: Troy Church Unijb'd Date: 02/26/2023   MRN: [id number] Sex: Male   Age: 32 y.o. Ethnicity: Non-Hispanic / Non Latino   DOB: 04-May-1991 Race: Other Asian      Troy Church is here for a well adult exam.       Subjective   History:  Troy Church presents today for annual physical:    -No significant changes to medical history.  Mood good.  No concerns.    -Needs refills.  Was off insurance and is now back on.  Did not have medications during that time.  GERD flared up while off medication.  Improves when on omeprazole .  Would like to see GI to discuss EGD.  Has eliminated spicy foods.  Occasional sinus congestion.  Would like nasal spray refilled.  Not taking any other medications and does not feel they need to be restarted.    -Declines vaccinations.  Agreeable to labs only to update blood work.    Review of Systems   Constitutional:  Negative for chills and fever.   HENT:  Negative for ear discharge, ear pain, hearing loss, sinus pressure, sinus pain and sore throat.    Eyes:  Negative for pain, discharge and redness.   Respiratory:  Negative for cough, shortness of breath and wheezing.    Cardiovascular:  Negative for chest pain and palpitations.   Gastrointestinal:  Positive for abdominal distention and abdominal pain. Negative for constipation, diarrhea, nausea and vomiting.   Genitourinary:  Negative for dysuria and hematuria.   Musculoskeletal:  Negative for myalgias.   Skin:  Negative for rash.   Neurological:  Negative for dizziness, weakness, light-headedness, numbness and headaches.   Psychiatric/Behavioral:  Negative for decreased concentration, dysphoric mood, self-injury, sleep disturbance and suicidal ideas. The patient is not nervous/anxious.        No Known Allergies  Prior to Visit Medications    Medication Sig Taking? Authorizing Provider   fluticasone  (FLONASE ) 50 MCG/ACT nasal spray 2 sprays by Each Nostril route daily Yes Brantly Kalman M, APRN - CNP   omeprazole  (PRILOSEC) 20 MG delayed  release capsule Take 1 capsule by mouth every morning (before breakfast) Yes Angeliyah Kirkey, Evalene HERO, APRN - CNP     Past Medical History:   Diagnosis Date    GERD (gastroesophageal reflux disease)      History reviewed. No pertinent surgical history.  Family History   Problem Relation Age of Onset    No Known Problems Mother     No Known Problems Father     No Known Problems Sister     No Known Problems Brother     No Known Problems Brother      Social History     Tobacco Use    Smoking status: Former     Current packs/day: 0.00     Average packs/day: 0.5 packs/day for 16.3 years (8.2 ttl pk-yrs)     Types: Cigarettes     Start date: 04/17/2005     Quit date: 08/07/2021     Years since quitting: 1.5    Smokeless tobacco: Never   Vaping Use    Vaping status: Former   Substance Use Topics    Alcohol use: Not Currently    Drug use: Not Currently     Types: Marijuana Oda)     Comment: occasionally           Objective   Vital Signs  BP 112/70 (Site: Left Upper Arm, Position: Sitting, Cuff Size: Small  Adult)   Pulse 59   Ht 1.702 m (5' 7)   Wt 80.3 kg (177 lb)   SpO2 97%   BMI 27.72 kg/m     Wt Readings from Last 3 Encounters:   02/24/23 80.3 kg (177 lb)   02/27/22 70.3 kg (155 lb)   11/26/21 71.7 kg (158 lb)       Physical Exam  Vitals reviewed.   Constitutional:       General: He is not in acute distress.     Appearance: Normal appearance. He is normal weight.   HENT:      Head: Normocephalic and atraumatic.      Right Ear: Tympanic membrane, ear canal and external ear normal.      Left Ear: Tympanic membrane, ear canal and external ear normal.      Nose: Nose normal.      Mouth/Throat:      Mouth: Mucous membranes are moist.      Pharynx: Oropharynx is clear. No posterior oropharyngeal erythema.   Eyes:      General: No scleral icterus.        Right eye: No discharge.         Left eye: No discharge.      Extraocular Movements: Extraocular movements intact.      Pupils: Pupils are equal, round, and reactive to light.    Neck:      Vascular: No carotid bruit.   Cardiovascular:      Rate and Rhythm: Normal rate and regular rhythm.      Pulses: Normal pulses.      Heart sounds: Normal heart sounds. No murmur heard.     No gallop.   Pulmonary:      Effort: Pulmonary effort is normal.      Breath sounds: Normal breath sounds. No wheezing.   Abdominal:      General: Bowel sounds are normal.      Palpations: Abdomen is soft.      Tenderness: There is no abdominal tenderness. There is no guarding or rebound.   Musculoskeletal:         General: Normal range of motion.      Cervical back: Normal range of motion and neck supple.   Lymphadenopathy:      Cervical: No cervical adenopathy.   Skin:     General: Skin is warm and dry.      Capillary Refill: Capillary refill takes less than 2 seconds.   Neurological:      Mental Status: He is alert and oriented to person, place, and time. Mental status is at baseline.   Psychiatric:         Mood and Affect: Mood normal.         Behavior: Behavior normal.         Thought Content: Thought content normal.         Judgment: Judgment normal.             Assessment & Plan   Encounter for well adult exam without abnormal findings  -Normal exam  -Fasting labs ordered to be completed at labs only  -Declined vaccinations  -Referring to GI  -Restarting omeprazole  and Flonase   -Follow-up annually, or sooner if needed  Gastroesophageal reflux disease, unspecified whether esophagitis present  -Uncontrolled despite omeprazole  and dietary adjustments.  Patient would like to see GI to discuss EGD.  Referral placed.  Restarting omeprazole .  Follow-up annually, or sooner if needed  -  [facility] - Latrelle Larve, MD, Gastroenterology, [place]  -     omeprazole  (PRILOSEC) 20 MG delayed release capsule; Take 1 capsule by mouth every morning (before breakfast), Disp-90 capsule, R-1Normal  Chronic pansinusitis  -Uncontrolled.  Patient has been off of Flonase .  Restarting.  Follow-up annually.  -     fluticasone   (FLONASE ) 50 MCG/ACT nasal spray; 2 sprays by Each Nostril route daily, Disp-48 g, R-1Normal  Pure hypercholesterolemia  -Not treated.  Labs ordered to be completed at labs only appointment.  No intervention pending results.  -     Comprehensive Metabolic Panel; Future  -     Lipid Panel; Future        Return in about 1 year (around 02/24/2024) for Annual Physical.     Personalized Preventive Plan  Current Health Maintenance Status    There is no immunization history on file for this patient.     Health Maintenance Due   Topic Date Due    Hepatitis C screen  Never done    Hepatitis B vaccine (1 of 3 - 19+ 3-dose series) Never done    DTaP/Tdap/Td vaccine (1 - Tdap) Never done    Flu vaccine (1) Never done    COVID-19 Vaccine (1 - 2023-24 season) Never done     Recommendations for Preventive Services Due: see orders and patient instructions/AVS.

## 2023-03-31 ENCOUNTER — Ambulatory Visit: Admit: 2023-03-31 | Discharge: 2023-03-31 | Payer: PRIVATE HEALTH INSURANCE | Primary: Family

## 2023-03-31 DIAGNOSIS — J343 Hypertrophy of nasal turbinates: Secondary | ICD-10-CM

## 2023-03-31 MED ORDER — TRIAMCINOLONE ACETONIDE 55 MCG/ACT NA AERO
55 | Freq: Every day | NASAL | 3 refills | Status: AC
Start: 2023-03-31 — End: ?

## 2023-03-31 MED ORDER — MONTELUKAST SODIUM 10 MG PO TABS
10 | ORAL_TABLET | Freq: Every day | ORAL | 1 refills | Status: AC
Start: 2023-03-31 — End: ?

## 2023-03-31 MED ORDER — CETIRIZINE HCL 10 MG PO TABS
10 | ORAL_TABLET | Freq: Every day | ORAL | 1 refills | Status: AC
Start: 2023-03-31 — End: ?

## 2023-03-31 MED ORDER — AZELASTINE HCL 0.1 % NA SOLN
0.1 | Freq: Two times a day (BID) | NASAL | 1 refills | Status: AC
Start: 2023-03-31 — End: ?

## 2023-03-31 NOTE — Assessment & Plan Note (Signed)
Chronic, not at goal (unstable), uncontrolled. Will try different nasal steroid brand (Nasacort, 2 sprays each nostril BID) along with antihistamine therapy, both nasal (Astelin 2 sprays each nostril BID) and PO (Zyrtec 10 mg daily). Highly concerning for worsening allergic rhinitis with discomfort for breathing, as well as fluid buildup in the sinus causing in fullness in ears. Start Singulair 10 mg daily. Check CBC to assess for possible eosinophilia. Referral to ENT ordered.

## 2023-03-31 NOTE — Consults (Signed)
Session ID: [id number]  Language: Nepali  Interpreter ID: [phone]  Interpreter Name: Iver Nestle

## 2023-03-31 NOTE — Progress Notes (Signed)
[facility] Interpreter service (language: Nepali - Iver Nestle, [phone]) was utilized to conduct today's encounter.    Chief Complaint   Patient presents with    Congestion     Started 1 year ago, bilateral soreness in his nostrils, congested nose, very dry throat, and short of breath. Patient denies chest pain, eyes watering or itching, fevers, cough. Patient did see a specialist last year, they prescribed a couple of nose sprays and this helped some with the sinuses but not with the shortness of breath. Never been diagnosed with asthma.          ASSESSMENT/PLAN    Problem List             Diagnosed       Respiratory    Nasal turbinate hypertrophy - Primary       Chronic, not at goal (unstable), uncontrolled. Will try different nasal steroid brand (Nasacort, 2 sprays each nostril BID) along with antihistamine therapy, both nasal (Astelin 2 sprays each nostril BID) and PO (Zyrtec 10 mg daily). Highly concerning for worsening allergic rhinitis with discomfort for breathing, as well as fluid buildup in the sinus causing in fullness in ears. Start Singulair 10 mg daily. Check CBC to assess for possible eosinophilia. Referral to ENT ordered.         Relevant Medications    triamcinolone (NASACORT) 55 MCG/ACT nasal inhaler    azelastine (ASTELIN) 0.1 % nasal spray    cetirizine (ZYRTEC) 10 MG tablet    montelukast (SINGULAIR) 10 MG tablet    Other Relevant Orders    Full PFT Study With Bronchodilator    CBC with Auto Differential    [place] - Lance Coon, Janyth Pupa, DO, Otolaryngology, [place]       Other    Shortness of breath       Chronic, not at goal (unstable), will need to rule out any obstructive or restrictive lung diseases. PFT and CXR ordered. Rest assessment and plan per above.         Relevant Medications    triamcinolone (NASACORT) 55 MCG/ACT nasal inhaler    azelastine (ASTELIN) 0.1 % nasal spray    cetirizine (ZYRTEC) 10 MG tablet    montelukast (SINGULAIR) 10 MG tablet    Other Relevant Orders    Full PFT Study With  Bronchodilator    XR CHEST (2 VW)    CBC with Auto Differential    [place] - Lance Coon, [place], DO, Otolaryngology, [place]         Patient was recommended to follow up with annual well health exam.    Return in about 6 months (around 09/29/2023) for Nasal congestion follow-up.    Subjective   Troy Church is a 32 y.o. male being seen in clinic for nasal congestion. Onset of symptom: 1 year ago. Previously was seen by different providers for the same concern and was given nasal sprays including alpha agonists and antihistamine. Currently uses Flonase. Has been concerned regarding nasal congestion and dry throat, sometimes causing in shortness of breath. Denied chest pain, watery eyes, fever, or cough.    Per chart review, patient was seen by the ENT provider (Dr. Lance Coon) for the same concern back in June 2023. Due to the presence of hypertrophy of both inferior nasal turbinates, that's when he was started with treatments with Flonase and Astelin along with PO Claritin. Negative finding on flexible fiberoptic laryngoscopy with normal vocal cords.    Review of Systems  Per HPI    Social History  Tobacco Use    Smoking status: Former     Current packs/day: 0.00     Average packs/day: 0.5 packs/day for 16.3 years (8.2 ttl pk-yrs)     Types: Cigarettes     Start date: 04/17/2005     Quit date: 08/07/2021     Years since quitting: 1.6    Smokeless tobacco: Never   Vaping Use    Vaping status: Former   Substance Use Topics    Alcohol use: Not Currently    Drug use: Not Currently     Types: Marijuana Sheran Fava)     Comment: occasionally       Patient Active Problem List   Diagnosis    Nasal turbinate hypertrophy    Shortness of breath        Objective   Vitals:  BP 108/80 (Site: Left Upper Arm, Position: Sitting, Cuff Size: Large Adult)   Pulse 76   Temp 98.7 F (37.1 C) (Oral)   Resp 14   SpO2 98%     Physical Exam:  Physical Exam  Constitutional:       General: He is not in acute distress.     Appearance: He is not  ill-appearing.   HENT:      Right Ear: Ear canal and external ear normal. A middle ear effusion is present. There is no impacted cerumen. Tympanic membrane is not injected, scarred or perforated.      Left Ear: Ear canal and external ear normal. A middle ear effusion is present. There is no impacted cerumen. Tympanic membrane is not injected, scarred or perforated.      Nose: Congestion and rhinorrhea present.      Mouth/Throat:      Mouth: Mucous membranes are moist.      Pharynx: Oropharynx is clear.   Cardiovascular:      Rate and Rhythm: Normal rate and regular rhythm.      Pulses: Normal pulses.      Heart sounds: Normal heart sounds.   Pulmonary:      Effort: Pulmonary effort is normal.      Breath sounds: Normal breath sounds. No stridor. No wheezing, rhonchi or rales.   Abdominal:      General: Bowel sounds are normal.      Palpations: Abdomen is soft.      Tenderness: There is no abdominal tenderness. There is no guarding.   Neurological:      Mental Status: He is alert.           BP Readings from Last 3 Encounters:   03/31/23 108/80   02/24/23 112/70   02/27/22 107/77     Wt Readings from Last 3 Encounters:   02/24/23 80.3 kg (177 lb)   02/27/22 70.3 kg (155 lb)   11/26/21 71.7 kg (158 lb)         The There is no height or weight on file to calculate BMI..       Plan was discussed with patient, who agrees. All questions were answered to patient's understanding and satisfaction.    I spent 44 minutes face-to-face with the patient, over half in discussion of the diagnosis and the importance of compliance with the treatment plan.      Coletta Memos, MD  03/31/2023 3:44 PM

## 2023-03-31 NOTE — Assessment & Plan Note (Signed)
Chronic, not at goal (unstable), will need to rule out any obstructive or restrictive lung diseases. PFT and CXR ordered. Rest assessment and plan per above.

## 2023-04-01 LAB — CBC WITH AUTO DIFFERENTIAL
Basophils %: 0.4 %
Basophils Absolute: 0 10*3/uL (ref 0.0–0.2)
Eosinophils %: 1.3 %
Eosinophils Absolute: 0.1 10*3/uL (ref 0.0–0.6)
Hematocrit: 45.1 % (ref 40.5–52.5)
Hemoglobin: 15.4 g/dL (ref 13.5–17.5)
Lymphocytes %: 41.3 %
Lymphocytes Absolute: 2.6 10*3/uL (ref 1.0–5.1)
MCH: 28.8 pg (ref 26.0–34.0)
MCHC: 34.1 g/dL (ref 31.0–36.0)
MCV: 84.3 fL (ref 80.0–100.0)
MPV: 8.5 fL (ref 5.0–10.5)
Monocytes %: 8.3 %
Monocytes Absolute: 0.5 10*3/uL (ref 0.0–1.3)
Neutrophils %: 48.7 %
Neutrophils Absolute: 3.1 10*3/uL (ref 1.7–7.7)
Platelets: 321 10*3/uL (ref 135–450)
RBC: 5.35 M/uL (ref 4.20–5.90)
RDW: 13.6 % (ref 12.4–15.4)
WBC: 6.4 10*3/uL (ref 4.0–11.0)

## 2023-04-02 ENCOUNTER — Inpatient Hospital Stay: Payer: PRIVATE HEALTH INSURANCE | Primary: Family

## 2023-04-02 ENCOUNTER — Inpatient Hospital Stay: Admit: 2023-04-02 | Payer: PRIVATE HEALTH INSURANCE | Primary: Family

## 2023-04-02 DIAGNOSIS — R0602 Shortness of breath: Secondary | ICD-10-CM

## 2023-04-04 ENCOUNTER — Ambulatory Visit
Admit: 2023-04-04 | Discharge: 2023-04-04 | Payer: PRIVATE HEALTH INSURANCE | Attending: Student in an Organized Health Care Education/Training Program | Primary: Family

## 2023-04-04 DIAGNOSIS — J3489 Other specified disorders of nose and nasal sinuses: Secondary | ICD-10-CM

## 2023-04-04 MED ORDER — MUPIROCIN 2 % EX OINT
2 | CUTANEOUS | 1 refills | Status: AC
Start: 2023-04-04 — End: ?

## 2023-04-04 NOTE — Consults (Signed)
Session ID: [id number]  Language: Nepali  Interpreter ID: #[id number]  Interpreter Name: Iver Nestle

## 2023-04-04 NOTE — Consults (Signed)
Session ID: [id number]  Language: Nepali  Interpreter ID: #[id number]  Interpreter Name: Wendie Chess

## 2023-04-04 NOTE — Progress Notes (Signed)
[facility]  DIVISION OF OTOLARYNGOLOGY- HEAD & NECK SURGERY  CLINIC FOLLOW-UP VISIT      Patient Name: Troy Church  Medical Record Number:  [id number]  Primary Care Physician:  Constance Holster, APRN - CNP    ChiefComplaint     Chief Complaint   Patient presents with    Sinus Problem       History of Present Illness     Troy Church is an 32 y.o. male previously seen for allergic rhinitis and ITH.     Interval History:   He intermittently gets sores in his nose.  When these arise he will get nasal congestion.  He removed a right sided nasal started approximately 1 year ago.  Occasionally when taking a deep breath through his nose he will get mild discomfort in his throat.  Denies shortness of breath.  Using nasal spray daily.  He feels like he has "swelling" in the right side of his nose.    H&P performed with the use of telehealth Nepali interpreter.     Past Medical History     Past Medical History:   Diagnosis Date    GERD (gastroesophageal reflux disease)        Past Surgical History     History reviewed. No pertinent surgical history.    Family History     Family History   Problem Relation Age of Onset    No Known Problems Mother     No Known Problems Father     No Known Problems Sister     No Known Problems Brother     No Known Problems Brother        Social History     Social History     Tobacco Use    Smoking status: Former     Current packs/day: 0.00     Average packs/day: 0.5 packs/day for 16.3 years (8.2 ttl pk-yrs)     Types: Cigarettes     Start date: 04/17/2005     Quit date: 08/07/2021     Years since quitting: 1.6    Smokeless tobacco: Never   Vaping Use    Vaping status: Former   Substance Use Topics    Alcohol use: Not Currently    Drug use: Not Currently     Types: Marijuana Sheran Fava)     Comment: occasionally        Allergies     No Known Allergies    Medications     Current Outpatient Medications   Medication Sig Dispense Refill    mupirocin (BACTROBAN) 2 % ointment Apply to inside of both nostrils  twice daily. 1 each 1    triamcinolone (NASACORT) 55 MCG/ACT nasal inhaler 2 sprays by Each Nostril route daily 2 each 3    azelastine (ASTELIN) 0.1 % nasal spray 2 sprays by Nasal route 2 times daily Use in each nostril as directed 120 mL 1    cetirizine (ZYRTEC) 10 MG tablet Take 1 tablet by mouth daily 90 tablet 1    montelukast (SINGULAIR) 10 MG tablet Take 1 tablet by mouth daily 90 tablet 1    omeprazole (PRILOSEC) 20 MG delayed release capsule Take 1 capsule by mouth every morning (before breakfast) 90 capsule 1     No current facility-administered medications for this visit.       Review of Systems     REVIEW OF SYSTEM: See HPI above    PhysicalExam     Vitals:    04/04/23  1520   BP: 122/76   Pulse: 69   SpO2: 97%   Height: 1.702 m (5\' 7" )       PHYSICAL EXAM  BP 122/76   Pulse 69   Ht 1.702 m (5\' 7" )   SpO2 97%   BMI 27.72 kg/m     GENERAL: No acute distress, alert and oriented.  EYES: EOMI, Anti-icteric.  NOSE: On anterior rhinoscopy there is no epistaxis, nasal mucosa semidry otherwise normal appearing, no purulent drainage. Bilateral inferior turbinates hypertrophied, larger on the right.  EARS: Normal external appearance; on portable otomicroscopy:     -Ad: External auditory canal without stenosis, tympanic membrane clear, no middle ear effusions or retractions     -As: External auditory canal without stenosis, tympanic membrane clear, no middle ear effusions or retractions  FACE: HB 1/6 bilaterally, symmetric appearing, sensation equal bilaterally  ORAL CAVITY: No masses or lesions visualized or palpated, uvula is midline, moist mucous membranes, no oropharyngeal masses or oropharyngeal obstruction. Tonsils 2+.   NECK: Normal range of motion, no thyromegaly, trachea is midline, no palpable lymphadenopathy or neck masses, no crepitus  CHEST: Normal respiratory effort, breathing comfortably, no retractions  SKIN: No rashes, normal appearing skin, no evidence of skin lesions/tumors  NEURO: Cranial  Nerves 2, 3, 4, 5, 6, 7, 11, 12 grossly intact bilaterally     I have performed a head and neck physical exam personally or was physically present during the key or critical portions of the service.    Assessment and Plan     1. Nasal mucosa dry  2. Nasal vestibulitis  -Start mupirocin ointment twice daily as needed for nasal sores and dryness    3. Hypertrophy of both inferior nasal turbinates  -Patient reassured that the swelling in the right side of his nose is just his inferior nasal turbinate.  Patient reassured.  He has been scoped in the past for similar issues with no evidence of nasal masses or polyps.  Continue Flonase daily.      Follow-Up     Return if symptoms worsen or fail to improve.      Dr. Justice Deeds, DO  [facility]  Department of Otolaryngology/Head and Neck Surgery  04/04/23    Medical Decision Making:  The following items were considered in medical decision making:  Independent review of images  Review / order clinical lab tests  Review / order radiology tests  Decision to obtain old records      This note was generated completely or in part utilizing Dragon dictation speech recognition software.  Occasionally, words are mistranscribed and despite editing, the text may contain inaccuracies due to incorrect word recognition.  If further clarification is needed please contact the office at [phone].

## 2023-04-07 ENCOUNTER — Inpatient Hospital Stay: Admit: 2023-04-07 | Discharge: 2023-04-18 | Payer: PRIVATE HEALTH INSURANCE | Primary: Family

## 2023-04-07 DIAGNOSIS — J343 Hypertrophy of nasal turbinates: Secondary | ICD-10-CM

## 2023-04-07 LAB — FULL PFT STUDY WITH BRONCHODILATOR
DLCO %Pred: 83 %
FEV1 %Pred-Post: 81 %
FEV1 %Pred-Pre: 78 %
FEV1/FVC-Post: 87 %
FEV1/FVC-Pre: 86 %
TLC Pre %Pred: 96 %

## 2023-04-07 NOTE — Procedures (Signed)
Pulmonary Function Testing      Patient name:  Troy Church     [facility] Unit #:   [id number]   Date of test:  04/07/2023  Date of interpretation:   04/08/2023    [name] is a 32 y.o. year-old.  The spirometry data was acceptable and reproducible per the ATS criteria.     Spirometry:  Pre-bronchodilator FVC: 3.79 L, 74 %  Pre-bronchodilator FEV1: 3.25 L, 78 %  FEV1/FVC: 86 %    Post-bronchodilator FVC: 3.85 L, 75 %, +1 % change   Post-bronchodilator FEV1: 3.35 L, 81 %, +3 % change   FEV1/FVC: 87 %    Lung volumes:  TLC: 6.35 L, 96 %  RV: 2.55 L, 144 %    Diffusion capacity:  DLCO: 24.0 ml/mmHg/min, 83 %    Flow Volume loop:  Normal     Interpretation:    Spirometry shows non specific ventilatory defect with mild reduction in FEV1  There was no significant bronchodilator response however this should not preclude the use of bronchodilator is clinically indicated  Lung volumes shows air trapping   Diffusion capacity is normal.     Mellody Dance, MD   Pulmonary and Critical Care Medicine  [facility]

## 2023-04-08 DIAGNOSIS — J343 Hypertrophy of nasal turbinates: Principal | ICD-10-CM

## 2023-08-12 ENCOUNTER — Ambulatory Visit: Admit: 2023-08-12 | Discharge: 2023-08-12 | Payer: PRIVATE HEALTH INSURANCE | Primary: Family

## 2023-08-12 VITALS — BP 128/72 | HR 99 | Temp 98.90000°F | Resp 14 | Wt 173.0 lb

## 2023-08-12 DIAGNOSIS — R21 Rash and other nonspecific skin eruption: Secondary | ICD-10-CM

## 2023-08-12 LAB — POCT URINALYSIS DIPSTICK
Bilirubin, UA: NEGATIVE
Glucose, UA POC: NEGATIVE mg/dL
Ketones, UA: NEGATIVE mg/dL
Nitrite, UA: NEGATIVE
Protein, UA POC: NEGATIVE mg/dL
Spec Grav, UA: 1.03
Urobilinogen, UA: 0.2 mg/dL
pH, UA: 6.5

## 2023-08-12 MED ORDER — TERBINAFINE HCL 1 % EX CREA
1 | CUTANEOUS | 0 refills | Status: DC
Start: 2023-08-12 — End: 2023-08-12

## 2023-08-12 MED ORDER — FLUCONAZOLE 150 MG PO TABS
150 | ORAL_TABLET | ORAL | 0 refills | Status: AC
Start: 2023-08-12 — End: 2023-08-20

## 2023-08-12 MED ORDER — SULFAMETHOXAZOLE-TRIMETHOPRIM 800-160 MG PO TABS
800-160 | ORAL_TABLET | Freq: Two times a day (BID) | ORAL | 0 refills | Status: AC
Start: 2023-08-12 — End: 2023-08-19

## 2023-08-12 NOTE — Assessment & Plan Note (Signed)
 Acute condition, new, Assessment and plan per Rash of Penis.

## 2023-08-12 NOTE — Progress Notes (Signed)
 Chief Complaint   Patient presents with    Rash     Started a few days ago with itching, redness, no bumps mild swelling, no discharge. No exposure that he knows of to STD.         ASSESSMENT/PLAN    Problem List             Diagnosed       Genitourinary    Rash of penis - Primary       Acute condition, new, concerning for complicated UTI with penile rash formation, could be due to combination of fungal and bacterial infection. Start 2 doses of Diflucan 150 mg, 1 week apart from each treatment for fungal coverage. Start 7-day course Bactrim BID. Urine sent for culture.         Relevant Medications    fluconazole (DIFLUCAN) 150 MG tablet    Other Relevant Orders    POCT Urinalysis no Micro (Completed)    Complicated UTI (urinary tract infection)       Acute condition, new, Assessment and plan per Rash of Penis.         Relevant Medications    fluconazole (DIFLUCAN) 150 MG tablet    sulfamethoxazole-trimethoprim (BACTRIM DS;SEPTRA DS) 800-160 MG per tablet    Other Relevant Orders    Culture, Urine         Patient was recommended to follow up with annual well health exam.    Return if symptoms worsen or fail to improve.    Subjective   Troy Church is a 33 y.o. male being seen in clinic for rash. Onset of symptom: 2-3 days ago. Symptoms include flash rash with pruritus. Location groin area. Did have some burning sensation with urination during the course of illness. Denied blood in urine. No known STI exposures. POCT positive for blood and leukocytes.    Review of Systems  Per HPI    Social History     Tobacco Use    Smoking status: Former     Current packs/day: 0.00     Average packs/day: 0.5 packs/day for 16.3 years (8.2 ttl pk-yrs)     Types: Cigarettes     Start date: 04/17/2005     Quit date: 08/07/2021     Years since quitting: 2.0    Smokeless tobacco: Never   Vaping Use    Vaping status: Former   Substance Use Topics    Alcohol use: Not Currently    Drug use: Not Currently     Types: Marijuana Sheran Fava)     Comment:  occasionally       Patient Active Problem List   Diagnosis    Nasal turbinate hypertrophy    Shortness of breath    Rash of penis    Complicated UTI (urinary tract infection)        Objective   Vitals:  BP 128/72 (Site: Left Upper Arm, Position: Sitting, Cuff Size: Large Adult)   Pulse 99   Temp 98.9 F (37.2 C) (Temporal)   Resp 14   Wt 78.5 kg (173 lb)   SpO2 99%   BMI 27.10 kg/m     Physical Exam:  Physical Exam  Vitals reviewed.   Constitutional:       General: He is not in acute distress.     Appearance: He is not ill-appearing.   HENT:      Head: Normocephalic and atraumatic.   Cardiovascular:      Rate and Rhythm: Normal rate and regular  rhythm.      Pulses: Normal pulses.      Heart sounds: Normal heart sounds.   Pulmonary:      Effort: Pulmonary effort is normal.      Breath sounds: Normal breath sounds.   Genitourinary:     Pubic Area: No rash.       Penis: Uncircumcised. Erythema and swelling present. No tenderness.       Testes: Normal.   Neurological:      Mental Status: He is alert.           BP Readings from Last 3 Encounters:   08/12/23 128/72   04/04/23 122/76   03/31/23 108/80     Wt Readings from Last 3 Encounters:   08/12/23 78.5 kg (173 lb)   02/24/23 80.3 kg (177 lb)   02/27/22 70.3 kg (155 lb)         The Body mass index is 27.1 kg/m.Marland Kitchen         Plan was discussed with patient, who agrees. All questions were answered to patient's understanding and satisfaction.    The CPT Code 16109 has been submitted for:  - Level of complexity: Moderate (1 new problem)  - Level of risk management: Moderate (prescription management, labs)      Coletta Memos, MD  08/12/2023 3:35 PM

## 2023-08-12 NOTE — Patient Instructions (Signed)
 You may receive a survey regarding the care you received during your visit.  Your input is valuable to [place].  We encourage you to complete and return your survey.  We hope you will choose [place] in the future for your healthcare needs.

## 2023-08-12 NOTE — Assessment & Plan Note (Signed)
 Acute condition, new, concerning for complicated UTI with penile rash formation, could be due to combination of fungal and bacterial infection. Start 2 doses of Diflucan 150 mg, 1 week apart from each treatment for fungal coverage. Start 7-day course Bactrim BID. Urine sent for culture.

## 2023-08-14 LAB — CULTURE, URINE: Urine Culture, Routine: NO GROWTH
